# Patient Record
Sex: Female | Born: 1965
Health system: Southern US, Community
[De-identification: ages and names within clinical notes are randomized; demographics above are authoritative.]

## PROBLEM LIST (undated history)

## (undated) DIAGNOSIS — Z9889 Other specified postprocedural states: Secondary | ICD-10-CM

## (undated) DIAGNOSIS — K5790 Diverticulosis of intestine, part unspecified, without perforation or abscess without bleeding: Secondary | ICD-10-CM

## (undated) DIAGNOSIS — R102 Pelvic and perineal pain: Secondary | ICD-10-CM

## (undated) DIAGNOSIS — D219 Benign neoplasm of connective and other soft tissue, unspecified: Secondary | ICD-10-CM

## (undated) DIAGNOSIS — K529 Noninfective gastroenteritis and colitis, unspecified: Secondary | ICD-10-CM

## (undated) DIAGNOSIS — R112 Nausea with vomiting, unspecified: Secondary | ICD-10-CM

## (undated) DIAGNOSIS — E785 Hyperlipidemia, unspecified: Secondary | ICD-10-CM

## (undated) HISTORY — PX: COLONOSCOPY: SHX174

## (undated) HISTORY — PX: OTHER SURGICAL HISTORY: SHX169

## (undated) HISTORY — DX: Hyperlipidemia, unspecified: E78.5

## (undated) HISTORY — PX: VAGINAL HYSTERECTOMY: SUR661

## (undated) HISTORY — PX: DILATION AND CURETTAGE OF UTERUS: SHX78

---

## 1997-12-01 ENCOUNTER — Other Ambulatory Visit: Admission: RE | Admit: 1997-12-01 | Discharge: 1997-12-01 | Payer: Self-pay | Admitting: Vascular Surgery

## 1998-06-09 ENCOUNTER — Emergency Department (HOSPITAL_COMMUNITY): Admission: EM | Admit: 1998-06-09 | Discharge: 1998-06-09 | Payer: Self-pay | Admitting: Emergency Medicine

## 1998-06-09 ENCOUNTER — Encounter: Payer: Self-pay | Admitting: Emergency Medicine

## 1999-01-23 ENCOUNTER — Other Ambulatory Visit: Admission: RE | Admit: 1999-01-23 | Discharge: 1999-01-23 | Payer: Self-pay | Admitting: Obstetrics and Gynecology

## 1999-04-13 ENCOUNTER — Ambulatory Visit (HOSPITAL_COMMUNITY): Admission: RE | Admit: 1999-04-13 | Discharge: 1999-04-13 | Payer: Self-pay | Admitting: Obstetrics and Gynecology

## 1999-04-13 ENCOUNTER — Encounter (INDEPENDENT_AMBULATORY_CARE_PROVIDER_SITE_OTHER): Payer: Self-pay

## 2000-05-12 ENCOUNTER — Other Ambulatory Visit: Admission: RE | Admit: 2000-05-12 | Discharge: 2000-05-12 | Payer: Self-pay | Admitting: Obstetrics and Gynecology

## 2001-01-09 ENCOUNTER — Encounter: Payer: Self-pay | Admitting: *Deleted

## 2001-01-09 ENCOUNTER — Encounter: Admission: RE | Admit: 2001-01-09 | Discharge: 2001-01-09 | Payer: Self-pay | Admitting: *Deleted

## 2001-04-09 ENCOUNTER — Encounter: Admission: RE | Admit: 2001-04-09 | Discharge: 2001-04-09 | Payer: Self-pay | Admitting: Obstetrics and Gynecology

## 2001-04-09 ENCOUNTER — Encounter: Payer: Self-pay | Admitting: Obstetrics and Gynecology

## 2001-05-18 ENCOUNTER — Other Ambulatory Visit: Admission: RE | Admit: 2001-05-18 | Discharge: 2001-05-18 | Payer: Self-pay | Admitting: Obstetrics and Gynecology

## 2001-12-31 ENCOUNTER — Encounter: Payer: Self-pay | Admitting: Emergency Medicine

## 2002-01-01 ENCOUNTER — Inpatient Hospital Stay (HOSPITAL_COMMUNITY): Admission: EM | Admit: 2002-01-01 | Discharge: 2002-01-03 | Payer: Self-pay | Admitting: Emergency Medicine

## 2002-06-01 ENCOUNTER — Other Ambulatory Visit: Admission: RE | Admit: 2002-06-01 | Discharge: 2002-06-01 | Payer: Self-pay | Admitting: Obstetrics and Gynecology

## 2003-08-01 ENCOUNTER — Other Ambulatory Visit: Admission: RE | Admit: 2003-08-01 | Discharge: 2003-08-01 | Payer: Self-pay | Admitting: Obstetrics and Gynecology

## 2004-09-08 ENCOUNTER — Encounter: Admission: RE | Admit: 2004-09-08 | Discharge: 2004-09-08 | Payer: Self-pay | Admitting: Neurology

## 2004-12-10 ENCOUNTER — Encounter (INDEPENDENT_AMBULATORY_CARE_PROVIDER_SITE_OTHER): Payer: Self-pay | Admitting: *Deleted

## 2004-12-10 ENCOUNTER — Inpatient Hospital Stay (HOSPITAL_COMMUNITY): Admission: RE | Admit: 2004-12-10 | Discharge: 2004-12-14 | Payer: Self-pay | Admitting: Neurosurgery

## 2004-12-19 ENCOUNTER — Ambulatory Visit: Payer: Self-pay | Admitting: Gastroenterology

## 2004-12-20 ENCOUNTER — Inpatient Hospital Stay (HOSPITAL_COMMUNITY): Admission: EM | Admit: 2004-12-20 | Discharge: 2004-12-21 | Payer: Self-pay | Admitting: Emergency Medicine

## 2005-01-23 ENCOUNTER — Ambulatory Visit: Payer: Self-pay | Admitting: Internal Medicine

## 2005-02-04 ENCOUNTER — Ambulatory Visit: Payer: Self-pay | Admitting: Internal Medicine

## 2007-01-22 ENCOUNTER — Ambulatory Visit: Payer: Self-pay

## 2007-02-05 ENCOUNTER — Ambulatory Visit: Payer: Self-pay | Admitting: Cardiovascular Disease

## 2007-02-05 LAB — CONVERTED CEMR LAB
CO2: 30 meq/L (ref 19–32)
Calcium: 9 mg/dL (ref 8.4–10.5)
Chloride: 109 meq/L (ref 96–112)
GFR calc Af Amer: 117 mL/min
GFR calc non Af Amer: 97 mL/min
Glucose, Bld: 86 mg/dL (ref 70–99)
Potassium: 4.1 meq/L (ref 3.5–5.1)
Sodium: 142 meq/L (ref 135–145)

## 2007-02-11 ENCOUNTER — Ambulatory Visit: Payer: Self-pay | Admitting: Cardiology

## 2008-08-20 DIAGNOSIS — K224 Dyskinesia of esophagus: Secondary | ICD-10-CM | POA: Insufficient documentation

## 2008-08-20 DIAGNOSIS — Z8679 Personal history of other diseases of the circulatory system: Secondary | ICD-10-CM | POA: Insufficient documentation

## 2008-08-20 DIAGNOSIS — Z8719 Personal history of other diseases of the digestive system: Secondary | ICD-10-CM

## 2010-01-19 ENCOUNTER — Encounter: Payer: Self-pay | Admitting: Internal Medicine

## 2010-06-26 NOTE — Letter (Signed)
Summary: Colonoscopy Letter  [facility]  [address] [place], [place] [address]   Phone: [phone]  Fax: [phone]      January 19, 2010 MRN: [id number]   [facility] [address] [place], [place]  [address]   Dear Ms. Burck,   According to your medical record, it is time for you to schedule a Colonoscopy. The American Cancer Society recommends this procedure as a method to detect early colon cancer. Patients with a family history of colon cancer, or a personal history of colon polyps or inflammatory bowel disease are at increased risk.  This letter has been generated based on the recommendations made at the time of your procedure. If you feel that in your particular situation this may no longer apply, please contact our office.  Please call our office at [phone] to schedule this appointment or to update your records at your earliest convenience.  Thank you for cooperating with [place] to provide you with the very best care possible.   Sincerely,  Wilhemina Bonito. Marina Goodell, M.D.  [facility] [phone]

## 2010-10-09 NOTE — Assessment & Plan Note (Signed)
[facility]                            CARDIOLOGY OFFICE NOTE   Crystal Brooks, Crystal Brooks                    MRN:          [id number]  DATE:02/05/2007                            DOB:          November 30, 1965    Crystal Brooks is a very pleasant 45 year old patient referred by [facility]  for chest pain.  The patient really does not have a primary care doctor.  She has been seen by Dr. Marina Goodell in our group before.  The patient was  shopping for cell phones at [facility] around August 16 when she had the  sudden severe onset of central chest pain radiating to the back.  The  intense pressure lasted about 5 minutes and then started to subside.  There was some radiation to her neck.  The total episode lasted about 20  minutes.  She had intermittent pains after that.  Never quite felt  right.  About 4 days later, she had a recurrence and was seen at  [facility].  Her EKG was normal.  Chest x-ray was apparently normal.  There were no acute changes.  I have an actual report that was read out  on August 21.   The patient was subsequently referred for cardiology followup.  I do  have some lab work from that visit, which included a hematocrit of 36,  white count of 4.0.  LFTs were normal.  Her LDL cholesterol was 139, BUN  11, creatinine 0.6.   Enzymes were negative.   Since that time, the patient has had intermittent pain.  Her initial  episode sounds like it may have been esophageal spasm.  She has also  complained of a cough, which has been a bit nagging over the last 2 to 3  months.  This may be secondary to reflux.  It does tend to occur with  recumbency.   She is a nonsmoker.  She has not had any particular allergies, and there  has been no exposure to mold or asbestos.   The patient initially was referred here.  I talked to [facility] on the  phone, and we approved a stress test on her prior to her seeing me.  I  reviewed the results of this with the patient.  The  stress test was done  January 22, 2007.  She exercised for 6 minutes 30 seconds under standard  Bruce protocol and stopped for fatigue.  The Myoview pictures were  totally normal with an EF of 74%.   I went over these results with the patient in detail.   REVIEW OF SYSTEMS:  Otherwise, unremarkable.   The patient is taking no medications on a regular basis.  She is  allergic to AMOXICILLIN and SULFA.   She is happily married.  She has one 40 year old child who goes to  [place].  She is fairly sedentary, but occasionally does aerobics at  home.  She does not smoke or drink.  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   SURGICAL HISTORY:  Remarkable for meningeoma removal in 2006 by Dr.  Newell Coral.  She apparently had some paresthesias in her  face  necessitating the surgery.   Mother is dead at age 23 with colon cancer.  Father died at age 37 of  CJD.  She has 1 brother who is alive.   EXAM:  Remarkable for a healthy-appearing white female in no distress.  Blood pressure is 140/80, pulse 90 and regular, weight is 176,  respiratory rate is 14.  She is afebrile.  HEENT:  Normal.  Carotids normal without bruit.  There is no lymphadenopathy, no  thyromegaly, no JVP elevation.  LUNGS:  Clear with good diaphragmatic motion.  No wheezing.  There is an S1, S2 with normal heart sounds.  PMI is normal.  ABDOMEN:  Benign.  Bowel sounds positive.  No AAA.  No tenderness.  No  hepatosplenomegaly.  No hepatojugular reflux.  Distal pulses are intact.  No edema.  There is no muscular weakness.  NEURO:  Nonfocal.  SKIN:  Warm and dry.   Her baseline EKG is normal.   IMPRESSION:  1. Chest pain, nonexertional, sounds more like esophageal spasm.      Myoview normal.  Limited risk factors.  Continue observation.  No      need for medication.  2. Question cough secondary to reflux.  Possible esophageal spasm.      Follow up with Dr. Marina Goodell.  Prescription for Protonix 40 mg a day      given.  3. A  history of meningioma status post resection by Dr. Newell Coral.      Follow up as needed.  I suspect he does a surveillance MR/CT every      3 years.  4. Since the patient has had recurring episodes of pain and it does      not appear to be cardiac, we will do a chest CT to rule out any      other mediastinal pathology.  She had a creatinine of 0.6 at urgent      care and I do not think this needs to be repeated.   So long as her CT scan does not show any abnormality, I will let her  follow up with Dr. Marina Goodell and she will have a trial of Protonix.     Crystal Brooks. Eden Emms, MD, [facility]  Electronically Signed    PCN/MedQ  DD: 02/05/2007  DT: 02/06/2007  Job #: [id number]   cc:   Shan Levans. Marina Goodell, MD

## 2010-10-12 NOTE — Discharge Summary (Signed)
NAME:  Crystal Brooks, Crystal Brooks NO.:  [id number]   MEDICAL RECORD NO.:  [id number]          PATIENT TYPE:  INP   LOCATION:  3027                         FACILITY:  [facility]   PHYSICIAN:  Hewitt Shorts, M.D.DATE OF BIRTH:  Nov 28, 1965   DATE OF ADMISSION:  12/10/2004  DATE OF DISCHARGE:  12/14/2004                                 DISCHARGE SUMMARY   HISTORY:  The patient is a 45 year old woman with a history of headaches  that were worse, with some numbness in the left face.  An MRI scan was  obtained which revealed a left cerebellar pontine angle tumor that was  located posterior to the left internal auditory meatus and suspected to be a  meningioma.  The decision was made to admit the patient for a craniotomy and  resection of the tumor.   PHYSICAL EXAMINATION:  GENERAL:  Unremarkable.  NEUROLOGIC:  Intact.   HOSPITAL COURSE:  The patient was admitted and underwent the left sub-  occipital craniectomy and resection of tumor with micro-dissection.  She did  well following surgery.  She did have some nausea that cleared.  She had a  moderate amount of incisional pain that has also eased.  Her vital signs  have been stable.  Her wound has healed nicely.  Her Decadron was tapered.  At this point she is up and ambulating in the halls.  She is neurologically  intact.  He wound is healing well.  The pathology reports a fibrous  meningioma.   DISPOSITION:  The patient is being discharged to home for further care.   FOLLOWUP:  She is to return next week for staple removal.   DISCHARGE MEDICATIONS:  1.  She is given a prescription for Decadron 4 mg, to take 1 tab this      evening, 1/2 tab b.i.d. tomorrow and 1/2 tab once daily on July 23rd and      December 17, 2004.  Then discontinue the Decadron.  2.  A Vicodin prescription also given for one or two tab q.4-6h. p.r.n.      pain, #50 tablets, no refills.  3.  She is also advised to use Aleve, two tab b.i.d. to help with some  of      the inflammation and soreness.   DISCHARGE DIAGNOSIS:  Left cerebral pontine angle meningioma.       RWN/MEDQ  D:  12/14/2004  T:  12/14/2004  Job:  [id number]

## 2010-10-12 NOTE — H&P (Signed)
NAME:  Crystal Brooks, Crystal Brooks                       ACCOUNT NO.:  [id number]   MEDICAL RECORD NO.:  [id number]                   PATIENT TYPE:  INP   LOCATION:  0447                                 FACILITY:  [facility]   PHYSICIAN:  Carroll Sage, M.D.              DATE OF BIRTH:  [birth year]   DATE OF ADMISSION:  12/31/2001  DATE OF DISCHARGE:                                HISTORY & PHYSICAL   HISTORY OF PRESENT ILLNESS:  The patient is a 45 year old female with no  past medical history, presenting with acute onset 24 hours ago of diarrhea,  loose frequent large amounts of stool occurring almost every hour or less  with significant rectal bleeding and large clumps after the diarrhea  resolved. She also had associated nausea. She denies any fevers or chills  associated with this. She also had some abdominal pain that would wax and  wane but not localized that would go to her back. She denies ever having any  of these symptoms in the past. She denies any chest pain, shortness of  breath, dysuria, lower extremity edema.   REVIEW OF SYSTEMS:  The rest of review of systems was negative. Denies  rashes or joint pain.   PAST MEDICAL HISTORY:  As stated, no past medical history.   PAST SURGICAL HISTORY:  No surgical history.   SOCIAL HISTORY:  Denies tobacco, alcohol, or drugs.   CURRENT MEDICATIONS:  No medications.   ALLERGIES:  She has a rash to Amoxicillin as her allergy.   PHYSICAL EXAMINATION:  VITAL SIGNS: Blood pressure was 132/92, pulse 122,  respiratory rate 18. O2 sat was 99% on room air with temperature of 99.6.  GENERAL: No apparent distress. Alert and oriented times three.  HEENT: Extraocular muscles intact. Pupils are equal, round, and reactive to  light and accommodation. She had dry oropharynx.  LUNGS: Clear to auscultation and percussion.  HEART: Normal S1 and S2. Sinus tachycardia. No murmur, rub, or gallop  appreciated.  ABDOMEN: Showed some mild tenderness in the  left lower quadrant. No rebound  or guarding. Hypoactive bowel sounds. No flank pain on examination. No lower  extremity edema.   LABORATORY DATA:  White count 12.5, hemoglobin and hematocrit 15 and 43.7,  platelet count 239, ketones greater than 80 in the urine. Nitrate and  lymphocyte esterase were negative. She had a negative pregnancy test. Sodium  was 136, potassium 4.2, chloride 106, CO2 24, BUN 16, creatinine 0.7,  glucose 105, normal LFTs.   DIAGNOSTIC STUDIES::  CT of the abdomen showed mucosal edema of the  rectosigmoid and descending colon, consistent with inflammatory, infectious,  or ischemic colitis.   ASSESSMENT:  This is a 45 year old female with no significant past medical  history, presenting with acute onset of diarrhea, dehydration and lower GI  bleeding, consistent with red blood.    PLAN:  Treat her as infectious with Cipro and Flagyl. Get stool studies.  Obtain a GI consult for possible colonoscopy and rehydrate aggressively.                                               Carroll Sage, M.D.    Angelyn Punt  D:  01/01/2002  T:  01/05/2002  Job:  [id number]

## 2010-10-12 NOTE — Discharge Summary (Signed)
NAME:  Crystal Brooks, Crystal Brooks NO.:  [id number]   MEDICAL RECORD NO.:  [id number]          PATIENT TYPE:  INP   LOCATION:  5509                         FACILITY:  [facility]   PHYSICIAN:  Malcolm T. Russella Dar, M.D. [facility] OF BIRTH:  [birth year]   DATE OF ADMISSION:  12/19/2004  DATE OF DISCHARGE:  12/21/2004                                 DISCHARGE SUMMARY   ADMISSION DIAGNOSES:  1.  Acute colitis, probably infectious.  2.  History of acute colitis, presumed secondary to infection in August      2003.  3.  Status post colonoscopy September 2003 revealing adenomatous colon      polyps and diverticulosis.  4.  Status post recent craniotomy and resection of meningioma on July 17 by      Dr. Newell Coral. Still having ongoing symptoms of numbness of the face and      scalp and dizziness.  5.  Sinus tachycardia, probably secondary to dehydration and volume      depletion associated with the acute colitis.  6.  Allergy to AMOXICILLIN.   DISCHARGE DIAGNOSES:  1.  Acute colitis, etiology presumed to be infectious. Symptoms much      improved with supportive care.  2.  Sinus tachycardia, resolved.  3.  Status post recent craniotomy and meningioma resection with residual      neurologic symptoms which are apparently not unexpected.   PROCEDURES:  None.   CONSULTATIONS:  None.   BRIEF HISTORY:  Mrs. Laham is a 45 year old white female. She underwent  craniotomy by Dr. Newell Coral July 17. He resected a left-sided meningioma.  Sutures were removed in the office yesterday, and her symptoms of dizziness  and numbness were not considered unexpected by Dr. Newell Coral.   The patient had constipation since she was first admitted for the surgery on  July 17 and had not moved her bowels for the duration of hospital stay and  for some days after returning home on July 21. The constipation may have  been secondary to moderate use of Vicodin for pain control. She has been  using about 4 Vicodin per  24 hours since she was discharged. The patient  began using laxatives, specifically some enemas and magnesium citrate on  July 25 through July 26. She developed acute crampy pain and diarrhea which  was bloody. She had several bowel movements, all of which had some amount of  blood, but she was not copiously bleeding. She was feeling unwell and  presented to the emergency room. The patient does not have a primary care  physician. Therefore, [facility] was consulted. Dr. Yancey Flemings had been a  consultant when she was admitted in the summer of 2003 with an acute colitis  whose symptoms were pretty much an exact replica of this admission's  presenting symptoms. At that time she was treated with Cipro and Flagyl, and  her symptoms resolved within a couple of days. She had followup colonoscopy  about 5 weeks after that acute episode in 2003. This revealed no colitis but  did show some diverticulosis, and adenomatous colon polyps were removed. She  is  due for followup colonoscopy around September 2006.   Dr. Russella Dar saw the patient in the emergency room. She was admitted to the GI  service for supportive care. She was afebrile. Her white blood cell count  was somewhat elevated at 18.6, but hemoglobin was 12.5. She did have slight  tachycardia when she first came to the emergency room with a pulse of 132,  but, with hydration, this ultimately normalized. She was not hypotensive. In  fact, her blood pressure on admission was 130/94.   LABORATORY DATA ON ADMISSION:  Sodium 137, potassium 4.4, glucose 106, BUN  9, creatinine 0.7. LFTs were within normal limits. White blood cell count  was 18.6, hemoglobin 15.9, hematocrit 46 and MCV 86. Platelets were 336,000.  Followup CBC on July 27 revealed a white blood cell count at 15.7 and a  hemoglobin of 12.5. Urinalysis was negative for nitrites, and there was a  small amount of leukocyte esterase present. However, only 3 to 6 white blood  cells and 0 to 2  red blood cells were present, and bacteria was present in  rare amounts.   CT scan of the abdomen and pelvis: There was wall thickening involving the  left colon with inflammatory versus infectious colitis being the most likely  etiology. There was a 1 cm filling defect in the gallbladder, possibly a  stone versus a polyp. If further characterization needed, a ultrasound could  be performed. There was no evidence for any perforation or abscess. On  pelvic view, there was some small amount of fluid in the pericolic gutters.   HOSPITAL COURSE:  The patient was admitted to non-telemetry bed. A she had  begun aggressive fluid hydration with normal saline with added potassium  while in the emergency room. Ultimately she was changed over to D5 half  normal saline. Overnight she did have a few more bloody bowel movements but  nothing like the volume or frequency she had been experiencing prior to her  present presentation. Initially antibiotics were not started. However, on  day #2 of her hospital stay, we did start oral Cipro and Flagyl. She  remained on a clear liquid diet which she tolerated.   By hospital day #3, the patient had had only a single bowel movement within  the last 16 hours, and this was not grossly bloody. Her abdominal pain had  diminished considerably. She was displaying just minimal tenderness to  palpation. She was stable for discharge and was discharged that morning of  July 27.   The patient was set up for outpatient colonoscopy at the [facility] on September 5 with a preop nurse visit on August 30. She was  provided with a prescription for Cipro and Flagyl, both of 1 week duration.   MEDICATIONS AT DISCHARGE:  1.  Vicodin 1 to 2 every 6 hours as needed.  2.  Tylenol as needed.  3.  She was advised to avoid Aleve, ibuprofen, and aspirin products.  4.  Metronidazole 500 milligrams 1 p.o. t.i.d. for 7 days. 5.  Cipro 500 milligrams 1 p.o. b.i.d. for 7  days.   CONDITION ON DISCHARGE:  Stable and improved.      SG/MEDQ  D:  12/21/2004  T:  12/21/2004  Job:  [id number]   cc:   Wilhemina Bonito. Marina Goodell, M.D. [facility]

## 2010-10-12 NOTE — H&P (Signed)
NAME:  Crystal Brooks, Crystal Brooks NO.:  [id number]   MEDICAL RECORD NO.:  [id number]          PATIENT TYPE:  INP   LOCATION:  NA                           FACILITY:  [facility]   PHYSICIAN:  Hewitt Shorts, M.D.DATE OF BIRTH:  01-30-1966   DATE OF ADMISSION:  DATE OF DISCHARGE:                                HISTORY & PHYSICAL   HISTORY OF PRESENT ILLNESS:  The patient is a 45 year old right-handed white  female who has had headaches for 30 years .  She has more difficulties  though over the past 6-7 months with occasional numbness and tingling that  began in the left V2 and V3 distribution that is associated with her  headaches.  She has had two episodes of blurred vision, once in December  2005 and again in January 2006 about four weeks apart.  The headaches  typically begin in the posterior aspect of her neck and extend up into the  occiput and vertex bilaterally.  She does not describe any hearing  difficulties, imbalance, facial pains, diplopia, seizures or weakness.  MRI  of the brain was obtained and revealed a left cerebellar pontine angle tumor  which appears to be posterior to the internal auditory meatus. A decision  was made to proceed with suboccipital craniectomy and resection of tumor.   PAST MEDICAL HISTORY:  She does not describe any history of hypertension,  myocardial infarction, cancer, strokes, diabetes, peptic ulcer disease or  lung disease.   PAST SURGICAL HISTORY:  No recent surgeries.   ALLERGIES:  AMOXICILLIN THAT CAUSES DIFFUSE RASH OVER HER BODY.   CURRENT MEDICATIONS:  She does not take any medications at this time.   FAMILY HISTORY:  Her parents are passed on.  Her mother died at age 32 of  colon cancer.  Her father died at age 10 of Creutzfeldt-Jakob disease.  Her  mother also had a history of hypertension and heart disease.   SOCIAL HISTORY:  The patient is married.  She is a housewife.  She does not  smoke.  She does drink alcoholic  beverages socially.  She denies any history  of substance abuse.   REVIEW OF SYSTEMS:  Notable for what is described in the history of present  illness, past medical history and review of systems other unremarkable.   PHYSICAL EXAMINATION:  GENERAL:  The patient is a well-developed, well-  nourished white female in no acute distress.  VITAL SIGNS:  Temperature 98.4, pulse 86, blood pressure 123/86, respiratory  rate 16, height 5 feet 9 inches, weight 164 pounds.  LUNGS:  Clear to auscultation.  She has symmetrical respiratory excursion.  HEART:  Regular rate and rhythm.  S1 and S2.  There is no murmur.  ABDOMEN:  Soft, nondistended, bowel sounds are present.  EXTREMITIES:  Shows no clubbing, cyanosis, or edema.  NEUROLOGIC:  The patient is awake, alert, fully oriented, her speech is  fluent and she has good comprehension.  On cranial nerve exam funduscopic  exam shows no evidence of papilledema, hemorrhages or exudates.  Motor  examination shows 5/5 strength in the upper and lower extremities.  She has  no drift of the upper extremities.  She has a normal gait and normal tandem  gait and normal stance.  Sensation is intact to pinprick throughout.  Reflexes are 1-2+ in the biceps, brachioradialis, triceps, quadriceps, and  gastrocnemious and are symmetrical bilaterally.  HEENT:  Pupils equal, round, and reactive to light about 3 mm bilaterally.  Extraocular movements are intact. Facial sensation is intact.  Facial  movement is symmetrical.  Hearing was present bilaterally.  Palatal movement  is symmetrical.  Shoulder shrug is symmetrical.  Tongue is midline.   IMPRESSION:  Left cerebellar pontine angle meningioma.  The patient is  neurologically intact.  Long-standing history of headaches but with some  recent numbness and tingling in the left V2 and V3 distribution as well as  two episodes of blurred vision about 6-7 months ago.   PLAN:  The patient will be admitted for left  suboccipital craniectomy and  resection of tumor with microdissection and microsurgery.  We discussed the  nature of surgery, the typical length of surgery and recuperation, and the  risks of surgery including infection, bleeding, possible neurologic  dysfunction including loss of facial sensation, changes in moving, hearing,  balance, swallowing as well as risk of coma and death.  We also discussed  the possibility of recurrence of tumor and risks of myocardial infarction,  stroke, pneumonia and death.  Patient stated she wishes to go ahead with  surgery and was admitted for such.       RWN/MEDQ  D:  12/10/2004  T:  12/10/2004  Job:  [id number]

## 2010-10-12 NOTE — H&P (Signed)
NAME:  Crystal Brooks, Crystal Brooks NO.:  [id number]   MEDICAL RECORD NO.:  [id number]          PATIENT TYPE:  INP   LOCATION:  5509                         FACILITY:  [facility]   PHYSICIAN:  Malcolm T. Russella Dar, M.D. [facility] OF BIRTH:  Jun 25, 1965   DATE OF ADMISSION:  12/19/2004  DATE OF DISCHARGE:                                HISTORY & PHYSICAL   PRIMARY CARE PHYSICIAN:  None.   GASTROENTEROLOGIST:  Wilhemina Bonito. Marina Goodell, M.D. [facility]   CHIEF COMPLAINT:  Bloody diarrhea and cramping abdominal pain.   HISTORY OF PRESENT ILLNESS:  Crystal Brooks is a 45 year old white female.  She  had just undergone craniotomy on July 17 by Dr. Shirlean Kelly.  She had  resection of a left sided meningioma.  Sutures were removed at Dr.  Earl Gala office yesterday.  She does have some residual numbness in the  face and scalp and some persistent dizziness, but apparently, this is not  unexpected.  GI wise, she had a history of what was presumed to be  infectious colitis in August 2003 when she presented with symptoms very  similar to her presenting symptoms today.  That is, she was and now is  having bloody diarrhea with abdominal pain that is a little bit worse on the  left side than the right side, but is somewhat diffuse.  In 2003,  microscopic culture studies on the stools were negative.  She was treated  with Cipro and Flagyl, and these symptoms resolved quickly.  She underwent  outpatient colonoscopy about a month after the initial event.  That was done  on February 11, 2002, by Dr. Marina Goodell.  He removed some polyps, and  diverticulosis was noted.  Do not known the result of the polyp pathology at  this point.  That will have to be investigated.   The patient notes that she had not had any bowel movements during her  hospital stay, going back at least to July 17.  Within the last 36 hours  prior to admission, she started using laxatives, specifically magnesium  citrate and some enemas.  At about 2:30  p.m. yesterday, she developed  crampy, abdominal pain with some nausea.  She finally started having stools,  and these were loose brown mixed with blood.  She has had persistent  stooling with blood noted mixed in with the stool.  The patient does not  generally use nonsteroidals or aspirin products, but she did take two  ibuprofen last night because of the pain, and because she was told not to  use her postoperative hydrocodone if she was not eating.  She says that she  was averaging about two hydrocodone twice a day since the time of surgery.  The patient has been urinating well.  She has not had any fever or chills.   ALLERGIES:  AMOXICILLIN.   MEDICATIONS:  Hydrocodone/APAP two p.o. b.i.d., though, she has not had any  within the last 24 hours.   PAST MEDICAL HISTORY:  1.  History of colon polyps.  2.  Colonic diverticulosis.  3.  Acute colitis.  4.  Status post resection of  a meningioma July 17.   SOCIAL HISTORY:  The patient lives in [place] with her husband, currently  unemployed.  She is married.   FAMILY HISTORY:  Mother had colon cancer.   REVIEW OF SYSTEMS:  The patient does have well water at home, but she drinks  bottled water.  The patient is urinating okay.  She denies chest pain or  shortness of breath.  Denies palpitations.  Denies extremity edema.  Menses  are regular, and last menses started around July 5.  Denies fever.  Has  numbness in the left side of her face and scalp.  Having dizziness  __________ which had been present prior to the surgery.  Denies any falls.  She denies any sick contacts or family members with similar GI symptoms.   LABORATORY DATA:  Hemoglobin 15.9, hematocrit 46, white blood cell count  18.6, platelets 336,000.  Total bilirubin 1.1, alk-phos 70, AST 16, ALT 25.  Sodium 137, potassium 4.4, chloride 101, CO2 25, BUN none, creatinine 0.7,  glucose 106.  Albumin 3.5.  PT 13.4, INR 1.0, PTT 30.  Urinalysis not  performed.  CT scan of  the abdomen and pelvis revealed left sided colitis.  A filling to defect in the gallbladder and uterine fibroid.   PHYSICAL EXAMINATION:  VITAL SIGNS:  Temperature 97.4, blood pressure  130/94, pulse 135, respirations 22.  GENERAL APPEARANCE:  The patient is a pleasant, pale looking white female  with flat affect.  She does look uncomfortable, but not toxic.  HEENT:  No pallor present.  Extraocular movements intact.  Healing scar in  the right posterior scalp.  Oropharynx:  Mucous membranes are clear and  moist.  NECK:  No masses.  No JVD.  CHEST:  Clear to auscultation and percussion bilaterally.  No displaying any  shortness of breath and not coughing.  COR:  Regular rate and rhythm, but she is tachycardic.  No murmurs, rubs or  gallops appreciated.  ABDOMEN:  Soft, diffusely tender in the mid abdomen.  A little bit worse on  the left than on the right.  There is no guarding or rebound.  Bowel sounds  are active.  RECTAL:  No stool on the exam glove.  The rectum is tender.  No friable-  looking tissue, and no gross blood.  EXTREMITIES:  No cyanosis, clubbing or edema.  PSYCHIATRIC:  Affect flat.  The patient is appropriate.  NEUROLOGICAL:  Moving all fours.  Grip and peel strength 5/5.  No tremors  present.   IMPRESSION:  1.  Acute colitis, probably infectious.  2.  History of acute colitis attributed to infectious etiology.  3.  History of colon polyps and history of diverticulosis.  4.  Status post recent craniotomy and meningioma resection with some      persistent and apparently not unexpected neurologic symptoms.  5.  Sinus tachycardia, probably secondary to element of dehydration and      volume depletion associated with her diarrhea.   PLAN:  1.  The patient will be admitted to the hospital and allowed clear liquids.      Will aggressively hydrate her.  2.  Follow up to CBC in the morning.  3.  Dilaudid and Phenergan p.r.n. 4.  For now, will hold off on antibiotics,  but consider addition of Cipro      and Flagyl to the medical regimen.       SG/MEDQ  D:  12/20/2004  T:  12/20/2004  Job:  [id number]

## 2010-10-12 NOTE — Op Note (Signed)
Crystal Brooks, NOYES NO.:  [id number]   MEDICAL RECORD NO.:  [id number]          PATIENT TYPE:  INP   LOCATION:  2899                         FACILITY:  [facility]   PHYSICIAN:  Hewitt Shorts, M.D.DATE OF BIRTH:  [birth year]   DATE OF PROCEDURE:  12/10/2004  DATE OF DISCHARGE:                                 OPERATIVE REPORT   PREOP DIAGNOSIS:  Left cerebellopontine angle meningioma.   POSTOP DIAGNOSIS:  Left cerebellopontine angle meningioma.   PROCEDURE:  Left suboccipital craniectomy and gross total resection of tumor  with a microdissection.   SURGEON:  Hewitt Shorts, M.D.   ASSISTANT:  Hilda Lias, M.D.   ANESTHESIA:  General endotracheal.   INDICATION:  The patient is a 45 year old woman who has a longstanding  history of headaches but who had some changes with numbness in the left side  of her face. An MRI was obtained and revealed a tumor in the left  cerebellopontine angle posterior to the internal auditory meatus. The  overall appearance radiologically was that of meningioma. The decision was  made to proceed with elective resection.   PROCEDURE IN DETAIL:  The patient was brought to the operating room and  placed under general endotracheal anesthesia.  The Mayfield head holder was  applied and the patient was turned to a near prone position with rolls  lifting the left side of the body slightly up off the bed and the neck  gently flexed. The left suboccipital scalp was shaved, prepped with Betadine  soap solution, and draped in a sterile fashion. A vertical paramedian  incision gently curved at the inferior aspect was made. The skin and  subcutaneous tissues were infiltrated with local anesthetic with  epinephrine. Dissection was carried down through the subcutaneous tissue and  then through the nuchal musculature. The periosteum was divided in the  occipitals. The skull was identified and the scalp elevated off of the  suboccipital  region. Dissection was carried out laterally toward the mastoid  process, medially toward the midline, and inferiorly toward the foramen  magnum. Self-retaining retractors were placed and we used an Apfelbaum  retractor. A suboccipital craniectomy was performed using the X Max drill,  double-action rongeurs, and Kerrison punches. The dura was carefully  dissected from the overlying skull and then the skull removed. Dissection  was carried out superiorly to the transverse sinus, laterally to the sigmoid  sinus. Once the skull exposure was adequate, the dura was opened in an X-  shaped fashion with flaps hinged superiorly towards the transverse sinus and  lateral to the sigmoid sinus. The microscope was draped and with the field  divided, additional magnification illumination and visualization, the  remainder of the dissection and ventral tumor resection was performed using  microsurgical dissection technique We exposed the cisterna magna which was  opened. CSF was aspirated and good relaxation of the cerebellar hemisphere  was achieved. A bridging vein between the cerebellum and the transverse  sinus was freed from its arachnoid adhesions, coagulated, and divided. We  then gently retracted the cerebellar hemisphere medially and the tumor was  identified. Using  the Apfelbaum retractor, we were able to expose the tumor  well, and the surface the tumor was coagulated and then incised. A specimen  of tumor was obtained for frozen section. Dr. Kieth Brightly reported that the  findings were consistent with a meningioma. Additional tumor was  subsequently obtained for permanent section, which is pending. Once the  capsule had been incised and the specimen was obtained, we then proceeded to  debulk the tumor from within the tumor capsule using the Cavitron ultrasonic  aspirator (CUSA). Good debulking of the tumor was achieved and we then  progressively coagulated the tumor's capsule so that it shrank  away from the  brain and cranial nerves We were able to remove the tumor using the CUSA.  Additional specimens were obtained for permanent section. The attachment to  the dura and the skull showed evidence of hyperostosis of the underlying  skull. We were able to remove the entire dural attachment of the tumor and  all the tumor was removed achieving a gross total resection of tumor  Subsequently, we were able to identify the nerves exiting through the  internal auditory meatus, as well as lower down, the ninth and tenth nerves.  The area of hyperostosis was cleaned of any remaining dural patch and using  a diamond bit bur to shave down the surface of the hyperostosis. It was felt  that a gross total resection of tumor had been achieved. The intracranial  cavity was irrigated with saline solution and it was found that good  hemostasis had been achieved. We then removed cottonoid paddies that had  been placed underneath the retractor after the retractor had been removed.  The brain remained relaxed and pulsating well, and we then proceeded with  closure. The dura was closed with interrupted 4-0 Nurolon sutures, as well  as a dural patch using dura guard which was sutured in place with multiple  interrupted 4-0 Nurolon sutures. A good watertight closure was achieved, and  then we proceeded with scalp closure. The nuchal muscles were approximated  with interrupted undyed 2-0 Vicryl sutures. The galea and subcutaneous layer  were closed with inverted 2-0 Vicryl sutures. The skin was closed with  surgical staples and was dressed with Adaptic and sterile gauze. Following  surgery, we began to prepare the patient to return back to the supine  position. However, she began to emerge from the anesthetic and she was  quickly turned back to supine position and the three pin Mayfield head  holder was removed. No bleeding from the scalp sites was noted. The patient was further reversed from anesthetic  and successfully extubated. She was  moving all four extremities, following commands. Subsequently in the  recovery room, she has been oriented, following commands, moving all four  extremities, but having difficulties with nausea and a small amount of  emesis.       RWN/MEDQ  D:  12/10/2004  T:  12/10/2004  Job:  [id number]

## 2010-10-12 NOTE — Discharge Summary (Signed)
NAME:  Crystal Brooks, Crystal Brooks                       ACCOUNT NO.:  [id number]   MEDICAL RECORD NO.:  [id number]                   PATIENT TYPE:  INP   LOCATION:  0452                                 FACILITY:  [facility]   PHYSICIAN:  Jackie Plum, M.D.             DATE OF BIRTH:  1965/07/08   DATE OF ADMISSION:  01/01/2002  DATE OF DISCHARGE:  01/03/2002                                 DISCHARGE SUMMARY   DISCHARGE DIAGNOSES:  Bloody diarrhea, likely infectious colitis - resolved.  CT scan done on January 01, 2002 remarkable for extensive colitis.  Urinalysis  remarkable for rare wbc's.  Clostridium difficile toxin negative.  Stool  culture for enteric pathogen - final results pending and needs to be  followed up as an outpatient.   DISCHARGE MEDICATIONS:  1. Cipro 500 mg one tablet p.o. b.i.d. for six days.  2. Flagyl 500 mg tablets one t.i.d. for six days.  3. Bentyl 10 mg tablet q.i.d. p.r.n. for abdominal cramps.  4. Darvocet-N 100 one to two tablets q.4-6h. p.r.n.   ACTIVITY:  As tolerated. Pain control with Darvocet p.r.n. as mentioned  above.   SPECIAL INSTRUCTIONS:  Patient should report to M.D. or ED if there are any  problems including fever, chills, diarrhea.  Follow-up will be with Dr.  Marina Goodell in two to three weeks.  The patient is to call for appointment.   CONSULTANT:  Dr. Marina Goodell of gastroenterology.   PROCEDURES:  Not applicable.   CONDITION ON DISCHARGE:  Improved and stable.   REASON FOR HOSPITALIZATION:  Bloody, watery diarrhea.   HOSPITAL COURSE:  The patient does not have any significant medical history.  Presented with history of bloody, watery stools.  The patient was admitted  to the medical service.  She was started on IV fluid supplementation with  ciprofloxacin and Flagyl for presumptive infectious diarrhea.  She was  treated with Darvocet for pain control and also Bentyl for abdominal cramps.  Stool studies were done.  Results as indicated above.  Dr.  Marina Goodell of GI was  consulted who agreed with our work-up.  His impression would be acute  ischemic colitis versus infectious colitis.  The patient gives history of  colon cancer in his family.  With supportive care patient's symptoms  improved markedly with absence of any bloody stools at the time of discharge  and outpatient colonoscopy was planned.  The patient is going home to  continue antibiotics for presumptive infectious diarrhea and also to follow  up with Dr. Marina Goodell as mentioned above for outpatient colonoscopy.                                               Jackie Plum, M.D.   GO/MEDQ  D:  01/03/2002  T:  01/07/2002  Job:  [id number]  cc:   Wilhemina Bonito. Eda Keys., M.D. [facility]

## 2011-02-20 ENCOUNTER — Other Ambulatory Visit: Payer: Self-pay | Admitting: Obstetrics

## 2011-02-28 ENCOUNTER — Other Ambulatory Visit: Payer: Self-pay | Admitting: Obstetrics

## 2011-02-28 DIAGNOSIS — D259 Leiomyoma of uterus, unspecified: Secondary | ICD-10-CM

## 2011-02-28 NOTE — Patient Instructions (Addendum)
   Your procedure is scheduled on:  Friday, March 08, 2011  Enter through the [employer] of [facility] at:  6:00am Pick up the phone at the desk and dial [phone] and inform us of your arrival  Please call this number if you have any problems the morning of surgery: [phone]  Remember: Do not eat food after midnight  Thursday, Oct.11th Do not drink clear liquids after:  Thursday, Oct.11th Take these medicines the morning of surgery with a SIP OF WATER:  none  Do not wear jewelry, make-up, or FINGER nail polish Do not wear lotions, powders, or perfumes.  You may wear deodorant. Do not shave 48 hours prior to surgery. Do not bring valuables to the hospital.  Leave suitcase in the car. After Surgery it may be brought to your room. For patients being admitted to the hospital, checkout time is 11:00am the day of discharge.  Patients discharged on the day of surgery will not be allowed to drive home.   Name and phone number of your driver:  husband Ray  cell [phone]   Remember to use your hibiclens as instructed.Please shower with 1/2 bottle the evening before your surgery and the other 1/2 bottle the morning of surgery.

## 2011-03-04 ENCOUNTER — Encounter (HOSPITAL_COMMUNITY)
Admission: RE | Admit: 2011-03-04 | Discharge: 2011-03-04 | Disposition: A | Discharge status: Home / Self Care | Payer: BC Managed Care – PPO | Source: Ambulatory Visit | Attending: Obstetrics | Admitting: Obstetrics

## 2011-03-04 ENCOUNTER — Encounter (HOSPITAL_COMMUNITY): Payer: Self-pay

## 2011-03-04 ENCOUNTER — Ambulatory Visit
Admission: RE | Admit: 2011-03-04 | Discharge: 2011-03-04 | Disposition: A | Discharge status: Home / Self Care | Payer: BC Managed Care – PPO | Source: Ambulatory Visit | Attending: Obstetrics | Admitting: Obstetrics

## 2011-03-04 DIAGNOSIS — D259 Leiomyoma of uterus, unspecified: Secondary | ICD-10-CM

## 2011-03-04 HISTORY — DX: Pelvic and perineal pain: R10.2

## 2011-03-04 HISTORY — DX: Other specified postprocedural states: R11.2

## 2011-03-04 HISTORY — DX: Benign neoplasm of connective and other soft tissue, unspecified: D21.9

## 2011-03-04 HISTORY — DX: Other specified postprocedural states: Z98.890

## 2011-03-04 HISTORY — DX: Noninfective gastroenteritis and colitis, unspecified: K52.9

## 2011-03-04 HISTORY — DX: Diverticulosis of intestine, part unspecified, without perforation or abscess without bleeding: K57.90

## 2011-03-04 LAB — CBC
HCT: 37.1 % (ref 36.0–46.0)
Hemoglobin: 11.3 g/dL — ABNORMAL LOW (ref 12.0–15.0)
MCH: 23.4 pg — ABNORMAL LOW (ref 26.0–34.0)
MCHC: 30.5 g/dL (ref 30.0–36.0)
MCV: 76.8 fL — ABNORMAL LOW (ref 78.0–100.0)
Platelets: 269 10*3/uL (ref 150–400)
RDW: 16.8 % — ABNORMAL HIGH (ref 11.5–15.5)

## 2011-03-04 LAB — SURGICAL PCR SCREEN
MRSA, PCR: NEGATIVE
Staphylococcus aureus: NEGATIVE

## 2011-03-04 LAB — BASIC METABOLIC PANEL
BUN: 11 mg/dL (ref 6–23)
GFR calc non Af Amer: >90 mL/min (ref >90)

## 2011-03-04 MED ORDER — GADOBENATE DIMEGLUMINE 529 MG/ML IV SOLN
14.0000 mL | Freq: Once | INTRAVENOUS | Status: AC | PRN
  Administered 2011-03-04: 14 mL via INTRAVENOUS

## 2011-03-07 MED ORDER — DEXTROSE 5 % IV SOLN: 900.0000 mg | Freq: Once | INTRAVENOUS | Status: DC

## 2011-03-07 MED ORDER — CLINDAMYCIN PHOSPHATE 900 MG/50ML IV SOLN
900.0000 mg | INTRAVENOUS | Status: AC
  Administered 2011-03-08: 900 mg via INTRAVENOUS
  Filled 2011-03-07: qty 50

## 2011-03-08 ENCOUNTER — Encounter (HOSPITAL_COMMUNITY): Payer: Self-pay | Admitting: Anesthesiology

## 2011-03-08 ENCOUNTER — Other Ambulatory Visit: Payer: Self-pay | Admitting: Obstetrics

## 2011-03-08 ENCOUNTER — Inpatient Hospital Stay (HOSPITAL_COMMUNITY): Admit: 2011-03-08 | Payer: Self-pay

## 2011-03-08 ENCOUNTER — Encounter (HOSPITAL_COMMUNITY)
Admission: RE | Disposition: A | Discharge status: Home / Self Care | Payer: Self-pay | Source: Ambulatory Visit | Attending: Obstetrics

## 2011-03-08 ENCOUNTER — Ambulatory Visit (HOSPITAL_COMMUNITY): Payer: BC Managed Care – PPO | Admitting: Anesthesiology

## 2011-03-08 ENCOUNTER — Ambulatory Visit (HOSPITAL_COMMUNITY)
Admission: RE | Admit: 2011-03-08 | Discharge: 2011-03-09 | Disposition: A | Discharge status: Home / Self Care | Payer: BC Managed Care – PPO | Source: Ambulatory Visit | Attending: Obstetrics | Admitting: Obstetrics

## 2011-03-08 DIAGNOSIS — Y9210 Unspecified residential institution as the place of occurrence of the external cause: Secondary | ICD-10-CM | POA: Insufficient documentation

## 2011-03-08 DIAGNOSIS — N949 Unspecified condition associated with female genital organs and menstrual cycle: Secondary | ICD-10-CM | POA: Insufficient documentation

## 2011-03-08 DIAGNOSIS — Z01818 Encounter for other preprocedural examination: Secondary | ICD-10-CM | POA: Insufficient documentation

## 2011-03-08 DIAGNOSIS — R0602 Shortness of breath: Secondary | ICD-10-CM | POA: Insufficient documentation

## 2011-03-08 DIAGNOSIS — T391X5A Adverse effect of 4-Aminophenol derivatives, initial encounter: Secondary | ICD-10-CM | POA: Insufficient documentation

## 2011-03-08 DIAGNOSIS — Z01812 Encounter for preprocedural laboratory examination: Secondary | ICD-10-CM | POA: Insufficient documentation

## 2011-03-08 DIAGNOSIS — D251 Intramural leiomyoma of uterus: Secondary | ICD-10-CM | POA: Insufficient documentation

## 2011-03-08 LAB — COMPREHENSIVE METABOLIC PANEL
ALT: 15 U/L (ref 0–35)
Alkaline Phosphatase: 76 U/L (ref 39–117)
BUN: 9 mg/dL (ref 6–23)
CO2: 21 mEq/L (ref 19–32)
Calcium: 9.1 mg/dL (ref 8.4–10.5)
GFR calc Af Amer: >90 mL/min (ref >90)
GFR calc non Af Amer: >90 mL/min (ref >90)
Glucose, Bld: 140 mg/dL — ABNORMAL HIGH (ref 70–99)
Sodium: 137 mEq/L (ref 135–145)

## 2011-03-08 LAB — CBC
HCT: 36.7 % (ref 36.0–46.0)
Hemoglobin: 11.7 g/dL — ABNORMAL LOW (ref 12.0–15.0)
MCH: 23.7 pg — ABNORMAL LOW (ref 26.0–34.0)
RBC: 4.94 MIL/uL (ref 3.87–5.11)

## 2011-03-08 LAB — PREGNANCY, URINE: Preg Test, Ur: NEGATIVE

## 2011-03-08 LAB — TYPE AND SCREEN

## 2011-03-08 SURGERY — ROBOTIC ASSISTED TOTAL HYSTERECTOMY
Anesthesia: General | Site: Abdomen | Wound class: Clean Contaminated

## 2011-03-08 MED ORDER — FENTANYL CITRATE 0.05 MG/ML IJ SOLN
INTRAMUSCULAR | Status: AC
  Administered 2011-03-08: 50 ug via INTRAVENOUS
  Filled 2011-03-08: qty 2

## 2011-03-08 MED ORDER — CIPROFLOXACIN IN D5W 400 MG/200ML IV SOLN
400.0000 mg | Freq: Two times a day (BID) | INTRAVENOUS | Status: DC
  Filled 2011-03-08 (×3): qty 200

## 2011-03-08 MED ORDER — DIPHENHYDRAMINE HCL 50 MG/ML IJ SOLN: 25.0000 mg | Freq: Four times a day (QID) | INTRAMUSCULAR | Status: DC | PRN

## 2011-03-08 MED ORDER — ZOLPIDEM TARTRATE 5 MG PO TABS: 5.0000 mg | ORAL_TABLET | Freq: Every evening | ORAL | Status: DC | PRN

## 2011-03-08 MED ORDER — ONDANSETRON HCL 4 MG/2ML IJ SOLN: 4.0000 mg | Freq: Four times a day (QID) | INTRAMUSCULAR | Status: DC | PRN

## 2011-03-08 MED ORDER — HYDROMORPHONE HCL 2 MG PO TABS: 4.0000 mg | ORAL_TABLET | Freq: Four times a day (QID) | ORAL | Status: DC | PRN

## 2011-03-08 MED ORDER — PROPOFOL 10 MG/ML IV EMUL
INTRAVENOUS | Status: AC
  Filled 2011-03-08: qty 40

## 2011-03-08 MED ORDER — KETOROLAC TROMETHAMINE 30 MG/ML IJ SOLN
INTRAMUSCULAR | Status: DC | PRN
  Administered 2011-03-08: 30 mg via INTRAVENOUS

## 2011-03-08 MED ORDER — ROCURONIUM BROMIDE 100 MG/10ML IV SOLN
INTRAVENOUS | Status: DC | PRN
  Administered 2011-03-08 (×2): 10 mg via INTRAVENOUS
  Administered 2011-03-08: 50 mg via INTRAVENOUS

## 2011-03-08 MED ORDER — MIDAZOLAM HCL 5 MG/5ML IJ SOLN
INTRAMUSCULAR | Status: DC | PRN
  Administered 2011-03-08: 2 mg via INTRAVENOUS

## 2011-03-08 MED ORDER — SUFENTANIL CITRATE 50 MCG/ML IV SOLN
INTRAVENOUS | Status: AC
  Filled 2011-03-08: qty 1

## 2011-03-08 MED ORDER — ROCURONIUM BROMIDE 50 MG/5ML IV SOLN
INTRAVENOUS | Status: AC
  Filled 2011-03-08: qty 1

## 2011-03-08 MED ORDER — SCOPOLAMINE 1 MG/3DAYS TD PT72: 1.0000 | MEDICATED_PATCH | Freq: Once | TRANSDERMAL | Status: DC

## 2011-03-08 MED ORDER — CIPROFLOXACIN IN D5W 400 MG/200ML IV SOLN
INTRAVENOUS | Status: DC | PRN
  Administered 2011-03-08: 400 mg via INTRAVENOUS

## 2011-03-08 MED ORDER — SCOPOLAMINE 1 MG/3DAYS TD PT72
MEDICATED_PATCH | TRANSDERMAL | Status: AC
  Administered 2011-03-08: 1.5 mg
  Filled 2011-03-08: qty 1

## 2011-03-08 MED ORDER — KETOROLAC TROMETHAMINE 30 MG/ML IJ SOLN
30.0000 mg | Freq: Four times a day (QID) | INTRAMUSCULAR | Status: DC
  Filled 2011-03-08: qty 1

## 2011-03-08 MED ORDER — SODIUM CHLORIDE 0.9 % IV SOLN
INTRAVENOUS | Status: DC
  Administered 2011-03-08 – 2011-03-09 (×2): via INTRAVENOUS

## 2011-03-08 MED ORDER — LIDOCAINE HCL (CARDIAC) 20 MG/ML IV SOLN
INTRAVENOUS | Status: AC
  Filled 2011-03-08: qty 5

## 2011-03-08 MED ORDER — DEXAMETHASONE SODIUM PHOSPHATE 10 MG/ML IJ SOLN
INTRAMUSCULAR | Status: AC
  Filled 2011-03-08: qty 1

## 2011-03-08 MED ORDER — GLYCOPYRROLATE 0.2 MG/ML IJ SOLN
INTRAMUSCULAR | Status: DC | PRN
  Administered 2011-03-08: .4 mg via INTRAVENOUS

## 2011-03-08 MED ORDER — SUFENTANIL CITRATE 50 MCG/ML IV SOLN
INTRAVENOUS | Status: DC | PRN
  Administered 2011-03-08 (×5): 10 ug via INTRAVENOUS

## 2011-03-08 MED ORDER — BISACODYL 5 MG PO TBEC
5.0000 mg | DELAYED_RELEASE_TABLET | Freq: Every day | ORAL | Status: DC | PRN
  Filled 2011-03-08: qty 1

## 2011-03-08 MED ORDER — DOCUSATE SODIUM 100 MG PO CAPS
100.0000 mg | ORAL_CAPSULE | Freq: Every day | ORAL | Status: DC
  Administered 2011-03-09: 100 mg via ORAL
  Filled 2011-03-08: qty 1

## 2011-03-08 MED ORDER — ONDANSETRON HCL 4 MG/2ML IJ SOLN
INTRAMUSCULAR | Status: DC | PRN
  Administered 2011-03-08: 4 mg via INTRAVENOUS

## 2011-03-08 MED ORDER — MENTHOL 3 MG MT LOZG: 1.0000 | LOZENGE | OROMUCOSAL | Status: DC | PRN

## 2011-03-08 MED ORDER — DEXAMETHASONE SODIUM PHOSPHATE 10 MG/ML IJ SOLN
INTRAMUSCULAR | Status: DC | PRN
  Administered 2011-03-08: 10 mg via INTRAVENOUS

## 2011-03-08 MED ORDER — FENTANYL CITRATE 0.05 MG/ML IJ SOLN
25.0000 ug | INTRAMUSCULAR | Status: DC | PRN
  Administered 2011-03-08 (×3): 50 ug via INTRAVENOUS

## 2011-03-08 MED ORDER — PROPOFOL 10 MG/ML IV EMUL
INTRAVENOUS | Status: DC | PRN
  Administered 2011-03-08: 40 mg via INTRAVENOUS
  Administered 2011-03-08: 50 mg via INTRAVENOUS
  Administered 2011-03-08: 160 mg via INTRAVENOUS

## 2011-03-08 MED ORDER — BUPIVACAINE HCL (PF) 0.25 % IJ SOLN
INTRAMUSCULAR | Status: DC | PRN
  Administered 2011-03-08: 13 mL

## 2011-03-08 MED ORDER — KETOROLAC TROMETHAMINE 30 MG/ML IJ SOLN
INTRAMUSCULAR | Status: AC
  Filled 2011-03-08: qty 1

## 2011-03-08 MED ORDER — ONDANSETRON HCL 4 MG/2ML IJ SOLN
INTRAMUSCULAR | Status: AC
  Filled 2011-03-08: qty 2

## 2011-03-08 MED ORDER — KETOROLAC TROMETHAMINE 30 MG/ML IJ SOLN
30.0000 mg | Freq: Four times a day (QID) | INTRAMUSCULAR | Status: DC
  Administered 2011-03-08 – 2011-03-09 (×3): 30 mg via INTRAVENOUS
  Filled 2011-03-08 (×2): qty 1

## 2011-03-08 MED ORDER — ALUM & MAG HYDROXIDE-SIMETH 200-200-20 MG/5ML PO SUSP: 30.0000 mL | ORAL | Status: DC | PRN

## 2011-03-08 MED ORDER — HYDROMORPHONE HCL 1 MG/ML IJ SOLN
1.0000 mg | INTRAMUSCULAR | Status: DC | PRN
  Administered 2011-03-08 – 2011-03-09 (×5): 1 mg via INTRAVENOUS
  Filled 2011-03-08 (×6): qty 1

## 2011-03-08 MED ORDER — PANTOPRAZOLE SODIUM 40 MG PO TBEC
40.0000 mg | DELAYED_RELEASE_TABLET | Freq: Every day | ORAL | Status: DC
  Filled 2011-03-08 (×3): qty 1

## 2011-03-08 MED ORDER — LACTATED RINGERS IR SOLN
Status: DC | PRN
  Administered 2011-03-08: 3000 mL

## 2011-03-08 MED ORDER — GLYCOPYRROLATE 0.2 MG/ML IJ SOLN
INTRAMUSCULAR | Status: AC
  Filled 2011-03-08: qty 2

## 2011-03-08 MED ORDER — IBUPROFEN 600 MG PO TABS: 600.0000 mg | ORAL_TABLET | Freq: Four times a day (QID) | ORAL | Status: DC | PRN

## 2011-03-08 MED ORDER — ACETAMINOPHEN 10 MG/ML IV SOLN
1000.0000 mg | Freq: Four times a day (QID) | INTRAVENOUS | Status: DC
  Administered 2011-03-08: 1000 mg via INTRAVENOUS
  Filled 2011-03-08 (×4): qty 100

## 2011-03-08 MED ORDER — NEOSTIGMINE METHYLSULFATE 1 MG/ML IJ SOLN
INTRAMUSCULAR | Status: DC | PRN
  Administered 2011-03-08: 2 mg via INTRAVENOUS

## 2011-03-08 MED ORDER — ACETAMINOPHEN 10 MG/ML IV SOLN
1000.0000 mg | Freq: Once | INTRAVENOUS | Status: AC
  Administered 2011-03-08: 1000 mg via INTRAVENOUS
  Filled 2011-03-08: qty 100

## 2011-03-08 MED ORDER — MIDAZOLAM HCL 2 MG/2ML IJ SOLN
INTRAMUSCULAR | Status: AC
  Filled 2011-03-08: qty 2

## 2011-03-08 MED ORDER — LIDOCAINE HCL (CARDIAC) 20 MG/ML IV SOLN
INTRAVENOUS | Status: DC | PRN
  Administered 2011-03-08: 60 mg via INTRAVENOUS

## 2011-03-08 MED ORDER — LACTATED RINGERS IV SOLN
INTRAVENOUS | Status: DC
  Administered 2011-03-08 (×3): via INTRAVENOUS

## 2011-03-08 MED ORDER — ONDANSETRON HCL 4 MG PO TABS: 4.0000 mg | ORAL_TABLET | Freq: Four times a day (QID) | ORAL | Status: DC | PRN

## 2011-03-08 MED ORDER — NEOSTIGMINE METHYLSULFATE 1 MG/ML IJ SOLN
INTRAMUSCULAR | Status: AC
  Filled 2011-03-08: qty 10

## 2011-03-08 SURGICAL SUPPLY — 70 items
BAG URINE DRAINAGE (UROLOGICAL SUPPLIES) ×2 IMPLANT
BARRIER ADHS 3X4 INTERCEED (GAUZE/BANDAGES/DRESSINGS) IMPLANT
BLADE LAP MORCELLATOR 15X9.5 (ELECTROSURGICAL) ×2 IMPLANT
BLADE LAPAROSCOPIC MORCELL KIT (BLADE) IMPLANT
BLADELESS LONG 8MM (BLADE) IMPLANT
BNDG COHESIVE 3X5 WHT NS (GAUZE/BANDAGES/DRESSINGS) ×2 IMPLANT
CABLE HIGH FREQUENCY MONO STRZ (ELECTRODE) ×2 IMPLANT
CATH FOLEY 3WAY  5CC 16FR (CATHETERS) ×1
CATH FOLEY 3WAY 5CC 16FR (CATHETERS) ×1 IMPLANT
CHLORAPREP W/TINT 26ML (MISCELLANEOUS) ×2 IMPLANT
CLOTH BEACON ORANGE TIMEOUT ST (SAFETY) ×2 IMPLANT
CONT PATH 16OZ SNAP LID 3702 (MISCELLANEOUS) ×2 IMPLANT
COVER MAYO STAND STRL (DRAPES) ×2 IMPLANT
COVER TABLE BACK 60X90 (DRAPES) ×4 IMPLANT
COVER TIP SHEARS 8 DVNC (MISCELLANEOUS) ×1 IMPLANT
COVER TIP SHEARS 8MM DA VINCI (MISCELLANEOUS) ×1
DECANTER SPIKE VIAL GLASS SM (MISCELLANEOUS) ×2 IMPLANT
DERMABOND ADVANCED (GAUZE/BANDAGES/DRESSINGS) ×1
DERMABOND ADVANCED .7 DNX12 (GAUZE/BANDAGES/DRESSINGS) ×1 IMPLANT
DRAPE HUG U DISPOSABLE (DRAPE) ×2 IMPLANT
DRAPE LG THREE QUARTER DISP (DRAPES) ×4 IMPLANT
DRAPE MONITOR DA VINCI (DRAPE) ×2 IMPLANT
DRAPE WARM FLUID 44X44 (DRAPE) ×2 IMPLANT
DRSG COVADERM PLUS 2X2 (GAUZE/BANDAGES/DRESSINGS) ×2 IMPLANT
ELECT REM PT RETURN 9FT ADLT (ELECTROSURGICAL) ×2
ELECTRODE REM PT RTRN 9FT ADLT (ELECTROSURGICAL) ×1 IMPLANT
EVACUATOR SMOKE 8.L (FILTER) ×2 IMPLANT
GAUZE VASELINE 3X9 (GAUZE/BANDAGES/DRESSINGS) ×2 IMPLANT
GLOVE BIO SURGEON STRL SZ 6.5 (GLOVE) ×2 IMPLANT
GLOVE BIOGEL PI IND STRL 7.0 (GLOVE) ×2 IMPLANT
GLOVE BIOGEL PI INDICATOR 7.0 (GLOVE) ×2
GOWN PREVENTION PLUS LG XLONG (DISPOSABLE) ×16 IMPLANT
IV STOPCOCK 4 WAY 40  W/Y SET (IV SOLUTION)
IV STOPCOCK 4 WAY 40 W/Y SET (IV SOLUTION) IMPLANT
KIT DISP ACCESSORY 4 ARM (KITS) ×2 IMPLANT
NEEDLE HYPO 22GX1.5 SAFETY (NEEDLE) IMPLANT
NEEDLE INSUFFLATION 14GA 120MM (NEEDLE) IMPLANT
OCCLUDER COLPOPNEUMO (BALLOONS) ×4 IMPLANT
PACK LAVH (CUSTOM PROCEDURE TRAY) ×2 IMPLANT
PAD PREP 24X48 CUFFED NSTRL (MISCELLANEOUS) ×4 IMPLANT
PLUG CATH AND CAP STER (CATHETERS) ×2 IMPLANT
POSITIONER SURGICAL ARM (MISCELLANEOUS) ×4 IMPLANT
SET IRRIG TUBING LAPAROSCOPIC (IRRIGATION / IRRIGATOR) ×2 IMPLANT
SOLUTION ELECTROLUBE (MISCELLANEOUS) ×2 IMPLANT
SPONGE LAP 18X18 X RAY DECT (DISPOSABLE) IMPLANT
STRIP CLOSURE SKIN 1/4X4 (GAUZE/BANDAGES/DRESSINGS) ×10 IMPLANT
SUT VIC AB 0 CT1 27 (SUTURE) ×5
SUT VIC AB 0 CT1 27XBRD ANBCTR (SUTURE) ×2 IMPLANT
SUT VIC AB 0 CT1 27XBRD ANTBC (SUTURE) ×3 IMPLANT
SUT VIC AB 0 CT2 27 (SUTURE) ×10 IMPLANT
SUT VIC AB 2-0 CT1 27 (SUTURE)
SUT VIC AB 2-0 CT1 TAPERPNT 27 (SUTURE) IMPLANT
SUT VIC AB 4-0 PS2 27 (SUTURE) ×4 IMPLANT
SUT VICRYL 0 UR6 27IN ABS (SUTURE) ×4 IMPLANT
SYR 50ML LL SCALE MARK (SYRINGE) ×2 IMPLANT
SYSTEM CONVERTIBLE TROCAR (TROCAR) ×2 IMPLANT
TIP UTERINE 5.1X6CM LAV DISP (MISCELLANEOUS) IMPLANT
TIP UTERINE 6.7X10CM GRN DISP (MISCELLANEOUS) ×2 IMPLANT
TIP UTERINE 6.7X6CM WHT DISP (MISCELLANEOUS) IMPLANT
TIP UTERINE 6.7X8CM BLUE DISP (MISCELLANEOUS) IMPLANT
TOWEL OR 17X24 6PK STRL BLUE (TOWEL DISPOSABLE) ×6 IMPLANT
TROCAR 12M 150ML BLUNT (TROCAR) IMPLANT
TROCAR DISP BLADELESS 8 DVNC (TROCAR) ×1 IMPLANT
TROCAR DISP BLADELESS 8MM (TROCAR) ×1
TROCAR XCEL 12X100 BLDLESS (ENDOMECHANICALS) ×2 IMPLANT
TROCAR XCEL NON-BLD 5MMX100MML (ENDOMECHANICALS) ×2 IMPLANT
TROCAR Z-THREAD BLADED 12X100M (TROCAR) ×2 IMPLANT
TUBING FILTER THERMOFLATOR (ELECTROSURGICAL) ×2 IMPLANT
WARMER LAPAROSCOPE (MISCELLANEOUS) ×2 IMPLANT
WATER STERILE IRR 1000ML POUR (IV SOLUTION) ×6 IMPLANT

## 2011-03-08 NOTE — Significant Event (Signed)
Rapid Response Event Note  Overview: Time Called: 1545 Arrival Time: 1546 Event Type: Other (Comment) (alergic reaction to IV Tylenol)  Initial Focused Assessment:   Interventions:   Event Summary:   at   Called to room by bedside RN for possible pt. Reaction to IV Tylenol. Infusion stopped right away. Pt. C/o SOB, severe RUQ abd. Pain and leg cramping. Pharmacy, Dr. Algie Coffer, RT, and Anesthesia notified. Pt. Symptoms resolved within approx. . But proceeded to exhibit same symptoms 3 more times intermittenly. Dr. Jean Rosenthal came to bedside. Pt. A&O, with continued arm and leg cramping. Labs ordered   at          Mallie Darting

## 2011-03-08 NOTE — Significant Event (Signed)
Rapid Response Event Note  Overview: Time Called: 1545 Arrival Time: 1546 Event Type: Other (Comment) (alergic reaction to IV Tylenol)  Initial Focused Assessment:   Interventions:   Event Summary:   at      at  1625 - Dr. Algie Coffer in to see pt.        Mallie Darting

## 2011-03-08 NOTE — Op Note (Signed)
Procedure:Robotic-assisted total laparoscopic hysterectomy Findings: Fibroid uterus- 607g, nl appearing cvx, nl b/l tubes and ovaries, nl liver edge. Pre-op dx: fibroids Post-op dx: same EBL: 150 cc Abx: Clinda, Cipro  Indications: This is a 45yo w/ bothersome fibroids (mass effect more bothersome than bleeding) who presents for laprascopic hysterectomy w/ retention of ovaries.  Procedure: After informed consent and discussion of alternatives to hysterectomy, the patient was taken to the operating room where general anesthesia was initiated without difficulty. She was prepped and draped in normal sterile fashion in the dorsal supine lithotomy position. A Foley catheter was inserted sterilely into the bladder. A bimanual examination was done to assess the size and position of the uterus. A weighted speculum was placed in the vagina. A vaginal wall retractor was used on the anterior vagina and  the cervix was grasped with tenaculum. The cervix was sounded with the uterine sound. It sounded to 10 cm. The cervix was assessed to identify the Rumi-Co size. A medium cup and an 10 cm shaft was used. This was easily threaded through the cervix, into the uterus and the cup seated nicely around the cervix. The uterine balloon was inflated.  Attention was then turned to the patient's abdomen. A 10 mm incision was made in the umbilicus after first infusing w/ 1/4 % marcaine. Blunt dissection was used until the fascia was identified. The fascia was grasped with Kocher clamps x 2, elevated and incised sharply. A purse string suture of 0-vicryl was placed around the fascial incision. A Hassan non bladed port was inserted after confirming proper placement with the camera. Pneumoperitoneum was created.  The abdominal wall was assessed found to be free of adhesions. The patient's abdominal wall externally was then marked with a marking pen for the planned port placement. A total of 20 cc of quarter percent Marcaine was used  to infuse at the planned port entry sites. An 8 mm skin incisions were made with the #11 blade. One left lower quadrant and 2 right lower quadrant planned port sites. Non-bladed robotics 8 mm ports were then placed under direct visualization. A 5mm non-bladed assistant port was placed in the LUQ. The robot was brought to the patient's side and attached with the right side docking. The robotic instruments were placed under direct visualization until proper placement just over the uterus.  I then went to the robotic console. Brief survey of the patient's abdomen and pelvis revealed normal findings. Bilateral ureters were identified and seen peristalsing well low in the pelvic sidewall. Bilateral tubes and ovaries were normal. The uterus was enlarged with multiple fibroids. No significant adhesions were noted. I began on the right side: the right  tube was serially cauterized with bipolar cautery with the use of the PK device and transected with the monopolar scissors. The right utero-ovarian ligament was serially cauterized with the bipolar PK and transected with the monopolar scissors. The anterior leaf of the broad ligament was dissected bluntly then monopolar cautery was used to separate the anterior and posterior leafs of the broad ligament. This was carried down through to the bladder flap. Attention was then turned to the patient's left side: the left tube was cauterized and transected,  the left uterine ovarian ligament was serially cauterized with the PK and transected with the monopolar scissors. The left broad ligament was opened up the anterior and posterior leaves were dissected apart from each other. Monopolar cautery was used to come across the anterior leaf of the left broad ligament. This dissection was  carried down across to the midline to create the bladder flap. The right uterine artery was serially cauterized with the PK and transected with the monopolar scissors. The left uterine artery and then  serially cauterized with the PK and transected with the monopolar scissors. The uterus was placed on stretch to allow better visualization of the arteries. The bladder flap was further taken down both sharply and with cautery. Cautery was used for hemostasis on several places along the bladder flap. Attention was turned to the posterior cervix and addition PK bipolar cautery was carried out.  At this point with the pressure inward on the Rumi, the colpotomy was made with the monopolar scissors anteriorly. This was carried around to the patient's right side. Additional bipolar cautery was needed on the right angle of the cuff- this was carried out with the PK bipolar. The uterus was then positioned anteriorly to allow easy access to the posterior colpotomy. This was carried out with the monopolar scissors. The posterior incision was carried around towards the patient's left. Good hemostasis was noted along the angles of the incision. The uterus was then placed on stretch to the patient's right allowing better visualization of the left angle. Again additional bipolar cautery was used on the left angle and monopolar scissors was used to complete the colpotomy.   The Rumi was removed vis the vagina and the uterus was left free in the abdomen.   Irrigation was used and the vaginal cuff appeared hemostatic. An 0 Vicryl on a CT-2 was then placed through the #3 laparoscopic port. Instruments were changed to allow a suture cut needle driver through the #1 port and and a long-tipped forceps through the #2 port. The right angle was closed in a figure-of-eight fashion. Another suture was placed and the left angle was closed with a figure-of-eight suture. 4 additional sutures were then thrown along the vaginal cuff, all figure of 8  sutures with 0-vicryl. Excellent hemostasis was noted. Suction irrigation was carried out and hemostasis was assured along the vaginal cuff. The utero-ovarian ligaments were reassessed also found  to be hemostatic.  Once the colpotomy was closed, the robot was undocked, the instruments were removed the the camera was changed to a 5mm camera. The morcellator was placed through the umbilical incision.  With the assistant manipulating the uterus into the lower pelvis to help improve visualization, the tenaculum was placed through the mocellator, used to grasp a part of the uterus and while deploying the morcellator, the uterus was brought to the morcellator and serially removed in many pieces. After each pass, the uterus was repositioned in order to grasp the uterus and morcellate in a safe position away from vital structures and pelvic wall. After all morcellation was done, small "chips" of uterine tissue were removed w/ laprascopic grasping forceps.   All free fluid was removed from the abdomen with the Nezhat suction irrigator. By standard laparoscopy the patient was placed in reverse Trendelenburg, all additional fluid was suctioned from the abdomen and pelvis the cuff was reinspected and  found to be hemostatic. All pedicles were also found to be hemostatic. Under direct visualization the ports were removed. Pneumoperitoneum was released and the umbilical port was removed. The pursestring suture at  the umbilical incision were grasped and tied. A 4-0 Vicryl was used to close the additional laparoscopic ports sites. Good hemostasis was noted.  Sponge lap and needle counts were correct x3 the patient was woken from general anesthesia having tolerated the procedure well and  taken to the recovery room in a stable fashion.  Jordyn Hofacker A. 02/19/2011 4:14 PM

## 2011-03-08 NOTE — Progress Notes (Signed)
Late Entry : Respiratory Therapy Note  Called to pt room for Rapid Response by Antony Blackbird, RN House Coverage, for possible reaction to IV Tylenol.  Arrived to find pt in pain, having some shortness of breath, and a flushed appearance.  Pt was able to speak although with some difficulty and could tell me she was short of breath.  BBS noted to be present and clear in upper and lower lobes.  Flushing dissipated after about 2 minutes.  Dr. Jean Rosenthal from Anesthesia arrived and evaluated pt.  Pt appeared to be recovering after about 20 minutes of my being there.

## 2011-03-08 NOTE — Anesthesia Preprocedure Evaluation (Signed)
Anesthesia Evaluation  Name, MR# and DOB Patient awake  General Assessment Comment  Reviewed: Allergy & Precautions, H&P , NPO status , Patient's Chart, lab work & pertinent test results, reviewed documented beta blocker date and time   Airway Mallampati: I TM Distance: >3 FB Neck ROM: full    Dental No notable dental hx. (+) Teeth Intact   Pulmonary    Pulmonary exam normal       Cardiovascular     Neuro/Psych Negative Psych ROS   GI/Hepatic negative GI ROS Neg liver ROS    Endo/Other  Negative Endocrine ROS  Renal/GU negative Renal ROS  Genitourinary negative   Musculoskeletal negative musculoskeletal ROS (+)   Abdominal Normal abdominal exam  (+)   Peds negative pediatric ROS (+)  Hematology negative hematology ROS (+)   Anesthesia Other Findings   Reproductive/Obstetrics negative OB ROS                           Anesthesia Physical Anesthesia Plan  ASA: II  Anesthesia Plan: General   Post-op Pain Management:    Induction: Intravenous  Airway Management Planned: Oral ETT  Additional Equipment:   Intra-op Plan:   Post-operative Plan: Extubation in OR  Informed Consent: I have reviewed the patients History and Physical, chart, labs and discussed the procedure including the risks, benefits and alternatives for the proposed anesthesia with the patient or authorized representative who has indicated his/her understanding and acceptance.     Plan Discussed with: CRNA  Anesthesia Plan Comments:         Anesthesia Quick Evaluation

## 2011-03-08 NOTE — Transfer of Care (Signed)
Immediate Anesthesia Transfer of Care Note  Patient: Crystal Brooks  Procedure(s) Performed:  ROBOTIC ASSISTED TOTAL HYSTERECTOMY  Patient Location: PACU  Anesthesia Type: General  Level of Consciousness: alert , oriented and sedated  Airway & Oxygen Therapy: Patient Spontanous Breathing and Patient connected to nasal cannula oxygen  Post-op Assessment: Report given to PACU RN and Post -op Vital signs reviewed and stable  Post vital signs: stable  Complications: No apparent anesthesia complications

## 2011-03-08 NOTE — H&P (Signed)
  See scanned docs under media tab.  No changes to above. Large symptomatic fibroid uterus, mass sx, all options reviewed. Plan attempted robotic assisted TLH. Pt understands operative risks  Noraa Pickeral A. 03/08/2011 7:20 AM

## 2011-03-08 NOTE — Brief Op Note (Signed)
03/08/2011  11:46 AM  PATIENT:  Crystal Brooks  45 y.o. female  PRE-OPERATIVE DIAGNOSIS:  Fibroids, Pelvic Pain  POST-OPERATIVE DIAGNOSIS:  Fibroids, Pelvic Pain  PROCEDURE:  Procedure(s): ROBOTIC ASSISTED TOTAL HYSTERECTOMY  SURGEON:  Surgeon(s): [facility]. Adlee Paar [place]ne Lavoie  PHYSICIAN ASSISTANT:   ASSISTANTS: Lavoie   ANESTHESIA:   general  EBL:  Total I/O In: 2200 [I.V.:2200] Out: 475 [Urine:325; Blood:150]  BLOOD ADMINISTERED:none CC PRBC  DRAINS: Urinary Catheter (Foley)   LOCAL MEDICATIONS USED:  MARCAINE 20CC  SPECIMEN:  Source of Specimen:  uterus and cervix  DISPOSITION OF SPECIMEN:  PATHOLOGY  COUNTS:  YES  TOURNIQUET:  * No tourniquets in log *  DICTATION: .Dragon Dictation and Note written in EPIC  PLAN OF CARE: Admit for overnight observation  PATIENT DISPOSITION:  PACU - hemodynamically stable.   Delay start of Pharmacological VTE agent (>24hrs) due to surgical blood loss or risk of bleeding:  Yes  Daksha Koone A. 03/08/2011 11:47 AM

## 2011-03-08 NOTE — Progress Notes (Signed)
Encounter addended by: Fanny Dance on: 03/08/2011  5:09 PM<BR>     Documentation filed: Notes Section

## 2011-03-08 NOTE — Significant Event (Addendum)
Rapid Response Event Note  Overview: Time Called: 1545 Arrival Time: 1546 Event Type: Other (Comment) (alergic reaction to IV Tylenol)  Initial Focused Assessment:   Interventions:   Event Summary:   at   Lungs clear. ECG normal Sinus Tach   at          Crystal Brooks

## 2011-03-08 NOTE — Anesthesia Postprocedure Evaluation (Signed)
Anesthesia Post Note  Patient: Crystal Brooks  Procedure(s) Performed:  ROBOTIC ASSISTED TOTAL HYSTERECTOMY  Anesthesia type: General  Patient location: PACU  Post pain: Pain level controlled  Post assessment: Post-op Vital signs reviewed  Last Vitals:  Filed Vitals:   03/08/11 1230  BP: 112/65  Pulse: 83  Temp: 99 F (37.2 C)  Resp: 14    Post vital signs: Reviewed  Level of consciousness: sedated  Complications: No apparent anesthesia complications

## 2011-03-08 NOTE — Anesthesia Procedure Notes (Signed)
Performed by: Finlee Milo     

## 2011-03-08 NOTE — Anesthesia Postprocedure Evaluation (Signed)
  Anesthesia Post-op Note  Patient: Crystal Brooks  Procedure(s) Performed:  ROBOTIC ASSISTED TOTAL HYSTERECTOMY  Patient Location: [facility]  Anesthesia Type: General  Level of Consciousness: alert  and oriented  Airway and Oxygen Therapy: Patient Spontanous Breathing  Post-op Pain: mild  Post-op Assessment: Patient's Cardiovascular Status Stable and Respiratory Function Stable  Post-op Vital Signs: stable  Complications: No apparent anesthesia complications

## 2011-03-08 NOTE — Progress Notes (Signed)
CTSP for "reaction" to IV Tylenol.  Pt and nurse report w/ initiation of IV Tylenol pt became short of breath, "gasping" to breath, also w/ RUQ pain that then radiated to L side. Pt also notes numbness/ tingling of legs and face. bp and pulse increased. IV tylenol infusion stopped, pt's sx improved some and pulse came down to low 100's. About 5 minutes later a second similar episode.  About 20 minutes after initial episode (and after stat evaluation by anasthesia) I saw pt. At this time pt appeared well, not diaphoretic, slightly anxious but breathing easily. Slightly pale  Stable vitals at the time of my eval other than pulse low 100's  See vitals flow  CV: RRR Pulm: CTAB Psych: anxious Abd: soft, NT, ND, incisions dressed, no bleeding GU: no bleeding vaginally (pad clean/ dry), foley draining clear urine LE: good sensation, 2+ DTR, no edema Neuro: A&O, good grip strength  Labs: CBC stable, Hgb 11.7. CMP wnl/ Cr 0.6  A/P: Reaction to IV Tylenol - pt improving, cont 1 L IVF bolus then resume 125cc/hr rate until tol reg po.  - Cont routine post-op orders. OK to trial of Dilaudid for pain - unclear etiology of reaction. Pt stable at present time, cont close monitoring.   Crystal Brooks A. 03/08/2011 5:34 PM

## 2011-03-09 ENCOUNTER — Encounter (HOSPITAL_COMMUNITY): Payer: Self-pay | Admitting: *Deleted

## 2011-03-09 MED ORDER — MEPERIDINE HCL 50 MG PO TABS
50.0000 mg | ORAL_TABLET | ORAL | Status: DC | PRN
  Administered 2011-03-09 (×2): 50 mg via ORAL
  Filled 2011-03-09 (×2): qty 1

## 2011-03-09 MED ORDER — DIPHENHYDRAMINE HCL 25 MG PO CAPS
25.0000 mg | ORAL_CAPSULE | Freq: Four times a day (QID) | ORAL | Status: DC | PRN
  Administered 2011-03-09 (×2): 25 mg via ORAL
  Filled 2011-03-09 (×2): qty 1

## 2011-03-09 MED ORDER — KETOROLAC TROMETHAMINE 10 MG PO TABS
10.0000 mg | ORAL_TABLET | Freq: Four times a day (QID) | ORAL | Status: DC | PRN
  Administered 2011-03-09: 10 mg via ORAL
  Filled 2011-03-09: qty 1

## 2011-03-09 NOTE — Progress Notes (Signed)
HD #2/ POD # 1  S: Pt notes pain overall well controlled, has been using Dilaudid and Toradol. Foley just out this am. Tol breakfast, no N/V. No vaginal bleeding. Pt notes itching this am after Dilaudid (h/o similar reaction to many narcotics in past). Pt and husband very concerned about reaction she had last night- very concerned this may happen again. No epidsodes since the one episode last pm after the IV tylenol.   O: Filed Vitals:   03/09/11 1000  BP: 104/65  Pulse: 89  Temp: 98.9 F (37.2 C)  Resp: 18    Gen: slightly anxious but overall well Skin: fine macular red rash on chest and back. Evidence of scatching. Tape reaction present CV: RRR Pulm: CTAB Abd: soft, approp tender, ND, good bowel sounds, incisions dressed, minimal bruising GU: no staining on pad LE: NT, no edema  CBC    Component Value Date/Time   WBC 13.2* 03/08/2011 1608   RBC 4.94 03/08/2011 1608   HGB 11.7* 03/08/2011 1608   HCT 36.7 03/08/2011 1608   PLT 224 03/08/2011 1608   MCV 74.3* 03/08/2011 1608   MCH 23.7* 03/08/2011 1608   MCHC 31.9 03/08/2011 1608   RDW 16.5* 03/08/2011 1608    A/P: POD#1 s/p robotic assisted TLH - post-op. Pt overall doing well. Saline lock IV, advance diet, awating void and flatus. Cont toradol and will switch to Demerol. Vitals/ exam/ CBC stable  - allergic reaction. Pt w/ reaction to IV Tynenol. Pt stable at this time, no further episodes and this IV med was d/c'd. Pt also with itching to Dilaudid- will change to Demerol and see how she does.  - Dispo. Pt very anxious about d/c this am due to reactions to meds. Will watch pt through the day and if does well d/c home later today.  Tyeesha Riker A. 03/09/2011 10:17 AM

## 2011-03-09 NOTE — Progress Notes (Signed)
D/c instructions reviewed with pt. And family, all state understanding of same,  No home equipment needed, ambulated to car with staff without incident, d/c'd home with family.

## 2011-03-09 NOTE — Significant Event (Signed)
Rapid Response Event Note  Overview: Time Called: 1545 Arrival Time: 1546 Event Type: Other (Comment) (alergic reaction to IV Tylenol)  Initial Focused Assessment:   Interventions:   Event Summary: Name of Physician Notified: Dr. Algie Coffer  at [phone]  Name of Consulting Physician Notified: Dr. Jean Rosenthal at 1600  Outcome: Stayed in room and stabalized  Event End Time: 1648  Jeannine Kitten  [id number]  Pt remains in room, stable.  VSS.  No further c/o sob, itching or rash/hives noted.

## 2011-03-11 NOTE — Discharge Summary (Signed)
PHILIPPA VESSEY MRN: [id number] DOB/AGE: Mar 11, 1966 45 y.o.  Admit date: 03/08/2011 Discharge date: 03/09/11 Admission Diagnoses: Fibroids, Pelvic Pain  Discharge Diagnoses: Fibroids, Pelvic Pain        Active Problems:  * No active hospital problems. *    Discharged Condition: stable  Hospital Course:  Uncomplicated TLH, post-op had reaction to IV tylenol where pt experienced SOB and had low bp and elevated pulse for about 10 minutes, light, fine macular rash w/ itching on back and chest, and notes sensation of tingling in lower extremities and face. This all resolved w/ stopping the IV infusion and hydration. Pt also w/ allergy and reported reactions of swelling, hives, itching to many narcotics in past. Given pt concerns about pain management and her reaction to the Tylenol, pt was observed throughout the day on POD#2 and was d/c'd late after trial of toradol and Demerol, which did well in terms of pain control and did not elicit any reactions.   Consults: none  Treatments: surgery: ROBOTIC ASSISTED TLH  Disposition: Home or Self Care   Meds: Demerol po for up to 48 hrs, Toradol nasal for up to 5 days    Signed: Kaari Zeigler A. 03/11/2011, 9:57 AM

## 2012-02-11 ENCOUNTER — Encounter: Payer: Self-pay | Admitting: Internal Medicine

## 2012-02-18 ENCOUNTER — Encounter: Payer: Self-pay | Admitting: Internal Medicine

## 2012-08-17 ENCOUNTER — Encounter: Payer: Self-pay | Admitting: Internal Medicine

## 2012-09-22 ENCOUNTER — Ambulatory Visit (AMBULATORY_SURGERY_CENTER): Payer: BC Managed Care – PPO

## 2012-09-22 VITALS — Ht 67.5 in | Wt 165.6 lb

## 2012-09-22 DIAGNOSIS — Z800 Family history of malignant neoplasm of digestive organs: Secondary | ICD-10-CM

## 2012-09-22 DIAGNOSIS — Z1211 Encounter for screening for malignant neoplasm of colon: Secondary | ICD-10-CM

## 2012-09-22 DIAGNOSIS — Z86010 Personal history of colonic polyps: Secondary | ICD-10-CM

## 2012-09-22 MED ORDER — MOVIPREP 100 G PO SOLR: ORAL | Status: DC

## 2012-09-23 ENCOUNTER — Encounter: Payer: Self-pay | Admitting: Internal Medicine

## 2012-10-06 ENCOUNTER — Encounter: Payer: BC Managed Care – PPO | Admitting: Internal Medicine

## 2012-11-10 ENCOUNTER — Encounter: Payer: Self-pay | Admitting: Internal Medicine

## 2012-11-10 ENCOUNTER — Ambulatory Visit (AMBULATORY_SURGERY_CENTER): Payer: BC Managed Care – PPO | Admitting: Internal Medicine

## 2012-11-10 VITALS — BP 101/56 | HR 69 | Temp 98.5°F | Resp 18 | Ht 67.5 in | Wt 165.0 lb

## 2012-11-10 DIAGNOSIS — Z1211 Encounter for screening for malignant neoplasm of colon: Secondary | ICD-10-CM

## 2012-11-10 DIAGNOSIS — Z800 Family history of malignant neoplasm of digestive organs: Secondary | ICD-10-CM

## 2012-11-10 DIAGNOSIS — Z860100 Personal history of colon polyps, unspecified: Secondary | ICD-10-CM

## 2012-11-10 MED ORDER — SODIUM CHLORIDE 0.9 % IV SOLN: 500.0000 mL | INTRAVENOUS | Status: DC

## 2012-11-10 NOTE — Op Note (Signed)
[facility] [address] [place], [address]   COLONOSCOPY PROCEDURE REPORT  PATIENT: Crystal Brooks, Crystal Brooks  MR#: [id number] BIRTHDATE: 1965-11-11 , 47  yrs. old GENDER: Female ENDOSCOPIST: Roxy Cedar, MD REFERRED XB:[id number] Program Recall PROCEDURE DATE:  11/10/2012 PROCEDURE:   Colonoscopy, surveillance ASA CLASS:   Class II INDICATIONS:Patient's immediate family history of colon cancer (parent 65)and Patient's personal history of adenomatous colon polyps (2003 w/ TA; F/u 2006 negative). MEDICATIONS: MAC sedation, administered by CRNA and propofol (Diprivan) 400mg  IV  DESCRIPTION OF PROCEDURE:   After the risks benefits and alternatives of the procedure were thoroughly explained, informed consent was obtained.  A digital rectal exam revealed no abnormalities of the rectum.   The LB [id number]  endoscope was introduced through the anus and advanced to the cecum, which was identified by both the appendix and ileocecal valve. No adverse events experienced.   The quality of the prep was excellent, using MoviPrep  The instrument was then slowly withdrawn as the colon was fully examined.      COLON FINDINGS: Mild diverticulosis was noted in the sigmoid colon. The colon was otherwise normal.  There was no  inflammation, polyps or cancers unless .  Retroflexed views revealed internal hemorrhoids. The time to cecum=3 minutes 19 seconds.  Withdrawal time=10 minutes 51 seconds.  The scope was withdrawn and the procedure completed.  COMPLICATIONS: There were no complications.  ENDOSCOPIC IMPRESSION: 1.   Mild diverticulosis was noted in the sigmoid colon 2.   The colon was otherwise normal  RECOMMENDATIONS: 1. Follow up colonoscopy in 5 years   eSigned:  Roxy Cedar, MD 11/10/2012 12:08 PM   cc: The Patient   PATIENT NAME:  Crystal Brooks, Crystal Brooks MR#: [id number]

## 2012-11-10 NOTE — Patient Instructions (Signed)
Mild diverticulosis.  Follow up colonoscopy in 5 years.    YOU HAD AN ENDOSCOPIC PROCEDURE TODAY AT THE [facility]: Refer to the procedure report that was given to you for any specific questions about what was found during the examination.  If the procedure report does not answer your questions, please call your gastroenterologist to clarify.  If you requested that your care partner not be given the details of your procedure findings, then the procedure report has been included in a sealed envelope for you to review at your convenience later.  YOU SHOULD EXPECT: Some feelings of bloating in the abdomen. Passage of more gas than usual.  Walking can help get rid of the air that was put into your GI tract during the procedure and reduce the bloating. If you had a lower endoscopy (such as a colonoscopy or flexible sigmoidoscopy) you may notice spotting of blood in your stool or on the toilet paper. If you underwent a bowel prep for your procedure, then you may not have a normal bowel movement for a few days.  DIET: Your first meal following the procedure should be a light meal and then it is ok to progress to your normal diet.  A half-sandwich or bowl of soup is an example of a good first meal.  Heavy or fried foods are harder to digest and may make you feel nauseous or bloated.  Likewise meals heavy in dairy and vegetables can cause extra gas to form and this can also increase the bloating.  Drink plenty of fluids but you should avoid alcoholic beverages for 24 hours.  ACTIVITY: Your care partner should take you home directly after the procedure.  You should plan to take it easy, moving slowly for the rest of the day.  You can resume normal activity the day after the procedure however you should NOT DRIVE or use heavy machinery for 24 hours (because of the sedation medicines used during the test).    SYMPTOMS TO REPORT IMMEDIATELY: A gastroenterologist can be reached at any hour.  During normal  business hours, 8:30 AM to 5:00 PM Monday through Friday, call [phone].  After hours and on weekends, please call the GI answering service at [phone] who will take a message and have the physician on call contact you.   Following lower endoscopy (colonoscopy or flexible sigmoidoscopy):  Excessive amounts of blood in the stool  Significant tenderness or worsening of abdominal pains  Swelling of the abdomen that is new, acute  Fever of 100F or higher  FOLLOW UP: If any biopsies were taken you will be contacted by phone or by letter within the next 1-3 weeks.  Call your gastroenterologist if you have not heard about the biopsies in 3 weeks.  Our staff will call the home number listed on your records the next business day following your procedure to check on you and address any questions or concerns that you may have at that time regarding the information given to you following your procedure. This is a courtesy call and so if there is no answer at the home number and we have not heard from you through the emergency physician on call, we will assume that you have returned to your regular daily activities without incident.  SIGNATURES/CONFIDENTIALITY: You and/or your care partner have signed paperwork which will be entered into your electronic medical record.  These signatures attest to the fact that that the information above on your After Visit Summary has been  reviewed and is understood.  Full responsibility of the confidentiality of this discharge information lies with you and/or your care-partner. 

## 2012-11-10 NOTE — Progress Notes (Signed)
Patient did not experience any of the following events: a burn prior to discharge; a fall within the facility; wrong site/side/patient/procedure/implant event; or a hospital transfer or hospital admission upon discharge from the facility. (G8907) Patient did not have preoperative order for IV antibiotic SSI prophylaxis. ([id number])  

## 2012-11-11 ENCOUNTER — Telehealth: Payer: Self-pay

## 2012-11-11 ENCOUNTER — Other Ambulatory Visit: Payer: Self-pay | Admitting: Dermatology

## 2012-11-11 NOTE — Telephone Encounter (Signed)
Left message to call back if any problems following procedure on Tuesday

## 2015-05-04 ENCOUNTER — Telehealth: Payer: Self-pay | Admitting: Cardiovascular Disease

## 2015-05-04 NOTE — Telephone Encounter (Signed)
05/04/2015 Received faxed referral packet from [facility] on patient for upcoming appointment with Dr. Gwenlyn Found on 05/15/2015.  Records given to [facility].  cbr

## 2015-05-15 ENCOUNTER — Encounter: Payer: Self-pay | Admitting: Cardiovascular Disease

## 2015-05-15 ENCOUNTER — Ambulatory Visit (INDEPENDENT_AMBULATORY_CARE_PROVIDER_SITE_OTHER): Payer: BLUE CROSS/BLUE SHIELD | Admitting: Cardiovascular Disease

## 2015-05-15 VITALS — BP 154/92 | HR 71 | Ht 68.0 in | Wt 176.9 lb

## 2015-05-15 DIAGNOSIS — E785 Hyperlipidemia, unspecified: Secondary | ICD-10-CM | POA: Diagnosis not present

## 2015-05-15 NOTE — Progress Notes (Signed)
     05/15/2015 Crystal Brooks   Mar 30, 1966  [id number]  Primary Physician No PCP Per Patient Primary Cardiologist: Lorretta Harp MD Renae Gloss   HPI:  Crystal Brooks is a very pleasant 49 year old mildly overweight married Caucasian female mother of one child does not work and was referred by her OB/GYN Dr. Aloha Brooks, for cardiovascular evaluation. She has no risk factors other than mildly elevated cholesterol. Her mother did have a PCI however in the past. She has never had a heart attack or stroke. She denies chest pain or shortness of breath. She had a hysterectomy approximately -4 years ago and gained 35 pounds after that and has had difficulty losing weight.   Current Outpatient Prescriptions  Medication Sig Dispense Refill  . Vitamin D, Ergocalciferol, (DRISDOL) 50000 UNITS CAPS capsule Take 1 capsule by mouth once a week. (For four (4) months)  0   No current facility-administered medications for this visit.    Allergies  Allergen Reactions  . Dilaudid [Hydromorphone Hcl] Itching and Rash  . Hydrocodone Hives  . Penicillins Hives  . Sulfa Drugs Cross Reactors Hives  . Tylenol [Acetaminophen]     Liquid Tylenol-SOB,hives, liver pain    Social History   Social History  . Marital Status: Married    Spouse Name: N/A  . Number of Children: N/A  . Years of Education: N/A   Occupational History  . Not on file.   Social History Main Topics  . Smoking status: Never Smoker   . Smokeless tobacco: Never Used  . Alcohol Use: 1.0 oz/week    2 drink(s) per week     Comment: occasionally - beer 10/month  . Drug Use: No  . Sexual Activity: Yes    Birth Control/ Protection: Condom   Other Topics Concern  . Not on file   Social History Narrative     Review of Systems: General: negative for chills, fever, night sweats or weight changes.  Cardiovascular: negative for chest pain, dyspnea on exertion, edema, orthopnea, palpitations, paroxysmal nocturnal  dyspnea or shortness of breath Dermatological: negative for rash Respiratory: negative for cough or wheezing Urologic: negative for hematuria Abdominal: negative for nausea, vomiting, diarrhea, bright red blood per rectum, melena, or hematemesis Neurologic: negative for visual changes, syncope, or dizziness All other systems reviewed and are otherwise negative except as noted above.    Blood pressure 154/92, pulse 71, height 5\' 8"  (1.727 m), weight 176 lb 14.4 oz (80.241 kg), last menstrual period 02/19/2011.  General appearance: alert and no distress Neck: no adenopathy, no carotid bruit, no JVD, supple, symmetrical, trachea midline and thyroid not enlarged, symmetric, no tenderness/mass/nodules Lungs: clear to auscultation bilaterally Heart: regular rate and rhythm, S1, S2 normal, no murmur, click, rub or gallop Extremities: extremities normal, atraumatic, no cyanosis or edema  EKG normal sinus rhythm at 71 without ST or T-wave changes. I personally reviewed this EKG  ASSESSMENT AND PLAN:   Hyperlipidemia History of hyperlipidemia with recent lipid profile performed 04/28/15 revealed a partial to 25, LDL 152 and HDL of 49. We talked about having a LDL less than 100 to me primary prevention guidelines. She prefers to try diet alone first. We will recheck a lipid liver profile in 3 months. If she is not at goal we will entertain statin therapy.      Lorretta Harp MD FACP,FACC,FAHA, [facility] 05/15/2015 4:31 PM

## 2015-05-15 NOTE — Assessment & Plan Note (Signed)
History of hyperlipidemia with recent lipid profile performed 04/28/15 revealed a partial to 25, LDL 152 and HDL of 49. We talked about having a LDL less than 100 to me primary prevention guidelines. She prefers to try diet alone first. We will recheck a lipid liver profile in 3 months. If she is not at goal we will entertain statin therapy.

## 2015-05-15 NOTE — Patient Instructions (Addendum)
Medication Instructions:  Your physician recommends that you continue on your current medications as directed. Please refer to the Current Medication list given to you today.  Labwork: Your physician recommends that you return for lab work in 3 months, fasting lipid and hepatic panel.  Testing/Procedure: Your physician has requested that you have cardiac CT. Cardiac computed tomography (CT) is a painless test that uses an x-ray machine to take clear, detailed pictures of your heart. For further information please visit [email or web address]. Please follow instruction sheet as given.  Follow-Up: We request that you follow-up in: 3 months with Dr Andria Rhein will receive a reminder letter in the mail two months in advance. If you don't receive a letter, please call our office to schedule the follow-up appointment.    If you need a refill on your cardiac medications before your next appointment, please call your pharmacy.

## 2015-05-16 ENCOUNTER — Telehealth: Payer: Self-pay | Admitting: Cardiovascular Disease

## 2015-05-16 NOTE — Telephone Encounter (Signed)
Received a call from Mack Guise scheduler at our [facility] office calling to verify which test Dr.Berry ordered 05/15/15.Spoke to [facility] he advised patient needs coronary calcium score.

## 2015-05-26 ENCOUNTER — Ambulatory Visit (INDEPENDENT_AMBULATORY_CARE_PROVIDER_SITE_OTHER)
Admission: RE | Admit: 2015-05-26 | Discharge: 2015-05-26 | Disposition: A | Discharge status: Home / Self Care | Payer: BLUE CROSS/BLUE SHIELD | Source: Ambulatory Visit | Attending: Cardiovascular Disease | Admitting: Cardiovascular Disease

## 2015-05-26 DIAGNOSIS — E785 Hyperlipidemia, unspecified: Secondary | ICD-10-CM

## 2015-06-01 ENCOUNTER — Telehealth: Payer: Self-pay | Admitting: Cardiovascular Disease

## 2015-06-01 NOTE — Telephone Encounter (Signed)
Results given, pt voiced understanding.

## 2015-06-01 NOTE — Telephone Encounter (Signed)
Pt would like her test results from 05-26-15 please.

## 2015-08-11 LAB — HEPATIC FUNCTION PANEL
ALT: 13 U/L (ref 6–29)
AST: 13 U/L (ref 10–35)
Albumin: 4.3 g/dL (ref 3.6–5.1)
Alkaline Phosphatase: 69 U/L (ref 33–130)
BILIRUBIN DIRECT: 0.1 mg/dL (ref <=0.2)
BILIRUBIN TOTAL: 0.7 mg/dL (ref 0.2–1.2)
Indirect Bilirubin: 0.6 mg/dL (ref 0.2–1.2)
Total Protein: 6.4 g/dL (ref 6.1–8.1)

## 2015-08-11 LAB — LIPID PANEL
CHOL/HDL RATIO: 4.4 ratio (ref <=5.0)
Cholesterol: 204 mg/dL — ABNORMAL HIGH (ref 125–200)
HDL: 46 mg/dL (ref >=46)
LDL Cholesterol: 137 mg/dL — ABNORMAL HIGH (ref <130)
Triglycerides: 106 mg/dL (ref <150)
VLDL: 21 mg/dL (ref <30)

## 2015-08-22 ENCOUNTER — Encounter: Payer: Self-pay | Admitting: Cardiovascular Disease

## 2015-08-22 ENCOUNTER — Ambulatory Visit (INDEPENDENT_AMBULATORY_CARE_PROVIDER_SITE_OTHER): Payer: BLUE CROSS/BLUE SHIELD | Admitting: Cardiovascular Disease

## 2015-08-22 VITALS — BP 143/80 | HR 86 | Ht 68.0 in | Wt 161.6 lb

## 2015-08-22 DIAGNOSIS — E785 Hyperlipidemia, unspecified: Secondary | ICD-10-CM

## 2015-08-22 MED ORDER — RED YEAST RICE 600 MG PO TABS: 600.0000 mg | ORAL_TABLET | Freq: Every day | ORAL | Status: DC

## 2015-08-22 NOTE — Progress Notes (Signed)
08/22/2015 Crystal Brooks   [birth year]  [id number]  Primary Physician No PCP Per Patient Primary Cardiologist: Crystal Harp MD Renae Gloss   HPI:  Crystal Brooks is a very pleasant 50 year old mildly overweight married Caucasian female mother of one child does not work and was referred by her OB/GYN Dr. Aloha Gell, for cardiovascular evaluation. I last saw her in the office 05/15/15.She has no risk factors other than mildly elevated cholesterol. Her mother did have a PCI however in the past. She has never had a heart attack or stroke. She denies chest pain or shortness of breath. She had a hysterectomy approximately -4 years ago and gained 35 pounds after that and has had difficulty losing weight. We had a discussion at her last office visit regarding risk factor modification and hyperlipidemia in particular. At that time, she preferred not to start a statin drug in essence lost 16 pounds and adjusted her diet appropriately. Her most recent lipid profile performed 08/10/15 revealed a total vessel to a 4, LDL of 137, down from 152 and HDL 46.   Current Outpatient Prescriptions  Medication Sig Dispense Refill  . Vitamin D, Ergocalciferol, (DRISDOL) 50000 UNITS CAPS capsule Take 1 capsule by mouth once a week. (For four (4) months)  0   No current facility-administered medications for this visit.    Allergies  Allergen Reactions  . Dilaudid [Hydromorphone Hcl] Itching and Rash  . Hydrocodone Hives  . Penicillins Hives  . Sulfa Drugs Cross Reactors Hives  . Tylenol [Acetaminophen]     Liquid Tylenol-SOB,hives, liver pain    Social History   Social History  . Marital Status: Married    Spouse Name: N/A  . Number of Children: N/A  . Years of Education: N/A   Occupational History  . Not on file.   Social History Main Topics  . Smoking status: Never Smoker   . Smokeless tobacco: Never Used  . Alcohol Use: 1.0 oz/week    2 drink(s) per week     Comment:  occasionally - beer 10/month  . Drug Use: No  . Sexual Activity: Yes    Birth Control/ Protection: Condom   Other Topics Concern  . Not on file   Social History Narrative     Review of Systems: General: negative for chills, fever, night sweats or weight changes.  Cardiovascular: negative for chest pain, dyspnea on exertion, edema, orthopnea, palpitations, paroxysmal nocturnal dyspnea or shortness of breath Dermatological: negative for rash Respiratory: negative for cough or wheezing Urologic: negative for hematuria Abdominal: negative for nausea, vomiting, diarrhea, bright red blood per rectum, melena, or hematemesis Neurologic: negative for visual changes, syncope, or dizziness All other systems reviewed and are otherwise negative except as noted above.    Blood pressure 143/80, pulse 86, height 5\' 8"  (1.727 m), weight 161 lb 9.6 oz (73.301 kg), last menstrual period 02/19/2011.  General appearance: alert and no distress Neck: no adenopathy, no carotid bruit, no JVD, supple, symmetrical, trachea midline and thyroid not enlarged, symmetric, no tenderness/mass/nodules Lungs: clear to auscultation bilaterally Heart: regular rate and rhythm, S1, S2 normal, no murmur, click, rub or gallop Extremities: extremities normal, atraumatic, no cyanosis or edema  EKG not performed today  ASSESSMENT AND PLAN:   Hyperlipidemia Hyperlipidemia not on statin therapy with lipid profile performed 08/10/15 revealing a total cholesterol 204, LDL 137 8 and HDL of 46. The LDL is down from 152. She does have a family history of coronary artery disease. She's lost  15 pounds and has adjusted her diet appropriately. We had a long discussion regarding statin therapy and primary prevention. As an intermediate step and decided to start her on red yeast a sterile recheck a lipid liver profile in 3 months. I will see her back after that to review the results and make further recommendations.      Crystal Harp MD FACP,FACC,FAHA, [facility] 08/22/2015 8:34 AM

## 2015-08-22 NOTE — Patient Instructions (Addendum)
Medication Instructions:  Your physician has recommended you make the following change in your medication:  1) START RED YEAST RICE 600 MG BY MOUTH DAILY.   Labwork: Your physician recommends that you return for lab work in: [place]. The lab can be found on the FIRST FLOOR of out building in [address]   Testing/Procedures: NONE  Follow-Up: Your physician wants you to follow-up in: [place], You will receive a reminder letter in the mail two months in advance. If you don't receive a letter, please call our office to schedule the follow-up appointment.   Any Other Special Instructions Will Be Listed Below (If Applicable).     If you need a refill on your cardiac medications before your next appointment, please call your pharmacy.

## 2015-08-22 NOTE — Assessment & Plan Note (Signed)
Hyperlipidemia not on statin therapy with lipid profile performed 08/10/15 revealing a total cholesterol 204, LDL 137 8 and HDL of 46. The LDL is down from 152. She does have a family history of coronary artery disease. She's lost 15 pounds and has adjusted her diet appropriately. We had a long discussion regarding statin therapy and primary prevention. As an intermediate step and decided to start her on red yeast a sterile recheck a lipid liver profile in 3 months. I will see her back after that to review the results and make further recommendations.

## 2015-10-05 DIAGNOSIS — M545 Low back pain: Secondary | ICD-10-CM | POA: Diagnosis not present

## 2015-10-05 DIAGNOSIS — M542 Cervicalgia: Secondary | ICD-10-CM | POA: Diagnosis not present

## 2015-10-05 DIAGNOSIS — M9901 Segmental and somatic dysfunction of cervical region: Secondary | ICD-10-CM | POA: Diagnosis not present

## 2015-10-05 DIAGNOSIS — M9903 Segmental and somatic dysfunction of lumbar region: Secondary | ICD-10-CM | POA: Diagnosis not present

## 2015-10-11 DIAGNOSIS — M9903 Segmental and somatic dysfunction of lumbar region: Secondary | ICD-10-CM | POA: Diagnosis not present

## 2015-10-11 DIAGNOSIS — M542 Cervicalgia: Secondary | ICD-10-CM | POA: Diagnosis not present

## 2015-10-11 DIAGNOSIS — M9901 Segmental and somatic dysfunction of cervical region: Secondary | ICD-10-CM | POA: Diagnosis not present

## 2015-10-11 DIAGNOSIS — M545 Low back pain: Secondary | ICD-10-CM | POA: Diagnosis not present

## 2015-10-19 DIAGNOSIS — M9901 Segmental and somatic dysfunction of cervical region: Secondary | ICD-10-CM | POA: Diagnosis not present

## 2015-10-19 DIAGNOSIS — M545 Low back pain: Secondary | ICD-10-CM | POA: Diagnosis not present

## 2015-10-19 DIAGNOSIS — M542 Cervicalgia: Secondary | ICD-10-CM | POA: Diagnosis not present

## 2015-10-19 DIAGNOSIS — M9903 Segmental and somatic dysfunction of lumbar region: Secondary | ICD-10-CM | POA: Diagnosis not present

## 2015-11-09 DIAGNOSIS — M545 Low back pain: Secondary | ICD-10-CM | POA: Diagnosis not present

## 2015-11-09 DIAGNOSIS — M542 Cervicalgia: Secondary | ICD-10-CM | POA: Diagnosis not present

## 2015-11-09 DIAGNOSIS — M9903 Segmental and somatic dysfunction of lumbar region: Secondary | ICD-10-CM | POA: Diagnosis not present

## 2015-11-09 DIAGNOSIS — M9901 Segmental and somatic dysfunction of cervical region: Secondary | ICD-10-CM | POA: Diagnosis not present

## 2015-11-23 DIAGNOSIS — M9901 Segmental and somatic dysfunction of cervical region: Secondary | ICD-10-CM | POA: Diagnosis not present

## 2015-11-23 DIAGNOSIS — M542 Cervicalgia: Secondary | ICD-10-CM | POA: Diagnosis not present

## 2015-11-23 DIAGNOSIS — M9903 Segmental and somatic dysfunction of lumbar region: Secondary | ICD-10-CM | POA: Diagnosis not present

## 2015-11-23 DIAGNOSIS — M545 Low back pain: Secondary | ICD-10-CM | POA: Diagnosis not present

## 2015-12-07 DIAGNOSIS — M545 Low back pain: Secondary | ICD-10-CM | POA: Diagnosis not present

## 2015-12-07 DIAGNOSIS — M542 Cervicalgia: Secondary | ICD-10-CM | POA: Diagnosis not present

## 2015-12-07 DIAGNOSIS — M9903 Segmental and somatic dysfunction of lumbar region: Secondary | ICD-10-CM | POA: Diagnosis not present

## 2015-12-07 DIAGNOSIS — M9901 Segmental and somatic dysfunction of cervical region: Secondary | ICD-10-CM | POA: Diagnosis not present

## 2015-12-29 DIAGNOSIS — E785 Hyperlipidemia, unspecified: Secondary | ICD-10-CM | POA: Diagnosis not present

## 2015-12-29 LAB — LIPID PANEL
CHOLESTEROL: 224 mg/dL — AB (ref 125–200)
HDL: 60 mg/dL (ref >=46)
LDL Cholesterol: 143 mg/dL — ABNORMAL HIGH (ref <130)
TRIGLYCERIDES: 106 mg/dL (ref <150)
Total CHOL/HDL Ratio: 3.7 Ratio (ref <=5.0)
VLDL: 21 mg/dL (ref <30)

## 2015-12-29 LAB — HEPATIC FUNCTION PANEL
ALT: 21 U/L (ref 6–29)
AST: 16 U/L (ref 10–35)
Albumin: 4.7 g/dL (ref 3.6–5.1)
Alkaline Phosphatase: 67 U/L (ref 33–130)
Bilirubin, Direct: 0.1 mg/dL (ref <=0.2)
Indirect Bilirubin: 0.5 mg/dL (ref 0.2–1.2)
TOTAL PROTEIN: 7 g/dL (ref 6.1–8.1)
Total Bilirubin: 0.6 mg/dL (ref 0.2–1.2)

## 2016-01-02 ENCOUNTER — Telehealth: Payer: Self-pay | Admitting: *Deleted

## 2016-01-02 NOTE — Progress Notes (Signed)
Sure

## 2016-01-02 NOTE — Telephone Encounter (Signed)
Attempted to reach patient to reschedule appt from Dr Gwenlyn Found to being seen in the [facility] instead. No answer. Left message to call back.    Notes Recorded by Rockne Menghini, RPH-CPP on 01/02/2016 at 7:23 AM EDT Would you like me to move her off your schedule and have her follow up with [place]? Looks like strictly primary prevention lipid patient. Won't qualify for PCSK-9 inhibitor, but we can review other options. ------  Notes Recorded by Lorretta Harp, MD on 12/30/2015 at 2:43 PM EDT Not sure that Red Yeast Rice is going to do the trick!!!!

## 2016-01-03 NOTE — Telephone Encounter (Signed)
Called patient back. Rescheduled her an appt for 01/04/16 with [facility] to discuss cholesterol. Appt with Dr Gwenlyn Found cancelled. She verbalized understanding.

## 2016-01-04 ENCOUNTER — Ambulatory Visit (INDEPENDENT_AMBULATORY_CARE_PROVIDER_SITE_OTHER): Payer: BLUE CROSS/BLUE SHIELD | Admitting: Pharmacist Clinician (PhC)/ Clinical Pharmacy Specialist

## 2016-01-04 ENCOUNTER — Encounter: Payer: Self-pay | Admitting: Pharmacist Clinician (PhC)/ Clinical Pharmacy Specialist

## 2016-01-04 VITALS — Ht 68.0 in | Wt 156.2 lb

## 2016-01-04 DIAGNOSIS — E785 Hyperlipidemia, unspecified: Secondary | ICD-10-CM

## 2016-01-04 DIAGNOSIS — M9903 Segmental and somatic dysfunction of lumbar region: Secondary | ICD-10-CM | POA: Diagnosis not present

## 2016-01-04 DIAGNOSIS — M545 Low back pain: Secondary | ICD-10-CM | POA: Diagnosis not present

## 2016-01-04 DIAGNOSIS — M9901 Segmental and somatic dysfunction of cervical region: Secondary | ICD-10-CM | POA: Diagnosis not present

## 2016-01-04 DIAGNOSIS — M542 Cervicalgia: Secondary | ICD-10-CM | POA: Diagnosis not present

## 2016-01-04 NOTE — Assessment & Plan Note (Signed)
Patient with no ASCVD and LDL of 143.  Had discussion about different types of cholesterol lowering medications.  She prefers not to take medications, but she was encouraged to work now to get this blood level down.  She is willing to try [place], as her brother takes this.  I also encouraged her to eat oatmeal and a few almonds each day.  Will repeat labs in 3 months, but suggested that if these changes don't bring her levels down, she will need to consider taking a statin.

## 2016-01-04 NOTE — Patient Instructions (Signed)
Try Cholestoff from [place] [employer] to help lower your cholesterol.  One tablet twice daily with meals.    Add in more oatmeal and almonds to your diet, they help lower cholesterol naturally.   Cholesterol Cholesterol is a fat. Your body needs a small amount of cholesterol. Cholesterol may build up in your blood vessels. This increases your chance of having a heart attack or stroke. You cannot feel your cholesterol levels. The only way to know your cholesterol level is high is with a blood test. Keep your test results. Work with your doctor to keep your cholesterol at a good level. WHAT DO THE TEST RESULTS MEAN?  Total cholesterol is how much cholesterol is in your blood.  LDL is bad cholesterol. This is the type that can build up. You want LDL to be low.  HDL is good cholesterol. It cleans your blood vessels and carries LDL away. You want HDL to be high.  Triglycerides are fat that the body can burn for energy or store. WHAT ARE GOOD LEVELS OF CHOLESTEROL?  Total cholesterol below 200.  LDL below 100 for people at risk. Below 70 for those at very high risk.  HDL above 50 is good. Above 60 is best.  Triglycerides below 150. HOW CAN I LOWER MY CHOLESTEROL?  Diet. Follow your diet programs as told by your doctor.  Choose fish, white meat chicken, roasted [place], or baked [place]. Try not to eat red meat, fried foods, or processed meats such as sausage and lunch meats.  Eat lots of fresh fruits and vegetables.  Choose whole grains, beans, pasta, potatoes, and cereals.  Use only small amounts of olive, corn, or canola oils.  Try not to eat butter, mayonnaise, shortening, or palm kernel oils.  Try not to eat foods with trans fats.  Drink skim or nonfat milk. Eat low-fat or nonfat yogurt and cheeses. Try not to drink whole milk or cream. Try not to eat ice cream, egg yolks, and full-fat cheeses.  Healthy desserts include angel food cake, ginger snaps, animal crackers, hard candy,  popsicles, and low-fat or nonfat frozen yogurt. Try not to eat pastries, cakes, pies, and cookies.  Exercise. Follow your exercise programs as told by your doctor.  Be more active. You can try gardening, walking, or taking the stairs. Ask your doctor about how you can be more active.  Medicine. Take medicine as told by your doctor.   This information is not intended to replace advice given to you by your health care provider. Make sure you discuss any questions you have with your health care provider.   Document Released: 08/09/2008 Document Revised: 06/03/2014 Document Reviewed: 02/24/2013 Elsevier Interactive Patient Education [employer].

## 2016-01-04 NOTE — Progress Notes (Signed)
01/04/2016 Tobias Alexander Tobler 22-Dec-1965 [id number]   HPI:  Crystal Brooks is a 50 y.o. female patient of Dr Gwenlyn Found, who presents today for a lipid clinic evaluation.  She originally saw Dr. Gwenlyn Found after her OB-GYN noted her elevated cholesterol levels.  Since that visit she was going to try red yeast rice, as she was not willing to take any prescription medications.  She never did start that, so her cholesterol drawn this month is a baseline with no medications.    Current Medications: none  Risk Factors: none, patient is primary prevention    Cholesterol Goals: LDL < 100   Intolerant/previously tried: has only tried dietary changes  Family history:  Both parents deceased mother at 84 with colon cancer, father at 65 with Creutzfeldt-Jacob disease.  Brother also has hyperlipidemia, she states he is controlled taking Cholestoff ([employer] plant sterol/stanol product)  Diet: eats fairly healthy, no dairy, rare red meat or pork, rare alcohol.  Had previously been told of the benefits of almonds, previously ate daily, but not recently.  Exercise: has home treadmill, walks 4-5 miles 6 days per week    Labs:  12/2015 - TC 224, TG 106, HDL 60, LDL 143 08/2015 - TC 204, TG 106, HDL 46, LDL 137  No current outpatient prescriptions on file.   No current facility-administered medications for this visit.     Allergies  Allergen Reactions  . Dilaudid [Hydromorphone Hcl] Itching and Rash  . Hydrocodone Hives  . Penicillins Hives  . Sulfa Drugs Cross Reactors Hives  . Tylenol [Acetaminophen]     Liquid Tylenol-SOB,hives, liver pain    Past Medical History:  Diagnosis Date  . Colitis   . Diverticulosis   . Fibroids   . Hyperlipidemia   . Pelvic pain in female   . PONV (postoperative nausea and vomiting)     Height 5\' 8"  (1.727 m), weight 156 lb 3.2 oz (70.9 kg), last menstrual period 02/19/2011.    Tommy Medal PharmD CPP [facility]

## 2016-01-10 ENCOUNTER — Ambulatory Visit: Payer: BLUE CROSS/BLUE SHIELD | Admitting: Cardiovascular Disease

## 2016-03-11 ENCOUNTER — Telehealth: Payer: Self-pay | Admitting: Pharmacist Clinician (PhC)/ Clinical Pharmacy Specialist

## 2016-03-11 NOTE — Telephone Encounter (Signed)
LMOM for patient to have cholesterol labs drawn in the next month.  Will set up follow up appt once the results are in

## 2016-04-08 ENCOUNTER — Other Ambulatory Visit: Payer: Self-pay | Admitting: Cardiovascular Disease

## 2016-04-08 DIAGNOSIS — E785 Hyperlipidemia, unspecified: Secondary | ICD-10-CM | POA: Diagnosis not present

## 2016-04-08 LAB — HEPATIC FUNCTION PANEL
ALBUMIN: 4.4 g/dL (ref 3.6–5.1)
ALT: 23 U/L (ref 6–29)
AST: 17 U/L (ref 10–35)
Alkaline Phosphatase: 76 U/L (ref 33–130)
BILIRUBIN DIRECT: 0.1 mg/dL (ref <=0.2)
Indirect Bilirubin: 0.6 mg/dL (ref 0.2–1.2)
TOTAL PROTEIN: 6.6 g/dL (ref 6.1–8.1)
Total Bilirubin: 0.7 mg/dL (ref 0.2–1.2)

## 2016-04-08 LAB — LIPID PANEL
CHOL/HDL RATIO: 3.6 ratio (ref <5.0)
CHOLESTEROL: 201 mg/dL — AB (ref <200)
HDL: 56 mg/dL (ref >50)
LDL Cholesterol: 125 mg/dL — ABNORMAL HIGH (ref <100)
TRIGLYCERIDES: 99 mg/dL (ref <150)
VLDL: 20 mg/dL (ref <30)

## 2016-04-09 ENCOUNTER — Telehealth: Payer: Self-pay | Admitting: Cardiovascular Disease

## 2016-04-09 NOTE — Telephone Encounter (Signed)
Left msg for patient to call. 

## 2016-04-09 NOTE — Telephone Encounter (Signed)
Crystal Brooks is calling in regards to her test results . Please cal

## 2016-04-10 ENCOUNTER — Other Ambulatory Visit: Payer: Self-pay | Admitting: Cardiovascular Disease

## 2016-04-10 DIAGNOSIS — E785 Hyperlipidemia, unspecified: Secondary | ICD-10-CM

## 2016-04-10 NOTE — Telephone Encounter (Signed)
Pt.notified

## 2016-04-10 NOTE — Telephone Encounter (Signed)
Lipids and LFT orders placed for one year. Will mail pt lab slips. Routed to scheduling for return ov.

## 2016-04-10 NOTE — Telephone Encounter (Signed)
Pt notified of result: Per result note: Excellent FLP and LFTs. Improved. Stress heart healthy diet  Pt states that she started Cholestoff as directed by [facility] at last visit.   Pt was wondering when she needs to follow up or have next labs done? Please advise

## 2016-04-10 NOTE — Telephone Encounter (Signed)
Lipid and liver profile in one year followed by return office visit time

## 2016-04-12 NOTE — Telephone Encounter (Signed)
Returning your call from 04-10-16.

## 2016-04-12 NOTE — Telephone Encounter (Signed)
Left message on pt cell phone stating that I have mailed the lab slips to have blood work done within the next year and that she does not need an appt until November 2018 per Dr. Gwenlyn Found.

## 2016-04-29 DIAGNOSIS — Z1231 Encounter for screening mammogram for malignant neoplasm of breast: Secondary | ICD-10-CM | POA: Diagnosis not present

## 2016-07-29 DIAGNOSIS — Z01419 Encounter for gynecological examination (general) (routine) without abnormal findings: Secondary | ICD-10-CM | POA: Diagnosis not present

## 2016-07-29 DIAGNOSIS — Z6824 Body mass index (BMI) 24.0-24.9, adult: Secondary | ICD-10-CM | POA: Diagnosis not present

## 2017-02-11 ENCOUNTER — Telehealth: Payer: Self-pay | Admitting: Cardiovascular Disease

## 2017-02-11 DIAGNOSIS — E785 Hyperlipidemia, unspecified: Secondary | ICD-10-CM

## 2017-02-11 NOTE — Telephone Encounter (Signed)
Lipid/liver lab(s) ordered. Mailed to patient w/instructions to have fasting labs

## 2017-02-11 NOTE — Telephone Encounter (Signed)
Pt says she needs lab work before her appt on 04-22-17. Please send her a lab order.

## 2017-04-07 DIAGNOSIS — E785 Hyperlipidemia, unspecified: Secondary | ICD-10-CM | POA: Diagnosis not present

## 2017-04-07 LAB — LIPID PANEL
Chol/HDL Ratio: 3.2 ratio (ref 0.0–4.4)
Cholesterol, Total: 194 mg/dL (ref 100–199)
HDL: 61 mg/dL (ref >39)
LDL Calculated: 112 mg/dL — ABNORMAL HIGH (ref 0–99)
Triglycerides: 106 mg/dL (ref 0–149)
VLDL Cholesterol Cal: 21 mg/dL (ref 5–40)

## 2017-04-07 LAB — HEPATIC FUNCTION PANEL
ALT: 29 IU/L (ref 0–32)
AST: 20 IU/L (ref 0–40)
Albumin: 4.6 g/dL (ref 3.5–5.5)
Alkaline Phosphatase: 79 IU/L (ref 39–117)
BILIRUBIN TOTAL: 0.6 mg/dL (ref 0.0–1.2)
Bilirubin, Direct: 0.12 mg/dL (ref 0.00–0.40)
Total Protein: 6.8 g/dL (ref 6.0–8.5)

## 2017-04-11 ENCOUNTER — Telehealth: Payer: Self-pay | Admitting: Cardiovascular Disease

## 2017-04-11 NOTE — Telephone Encounter (Signed)
New message     Pt is returning [place] call for lab results.  Leave message on voicemail with results

## 2017-04-14 NOTE — Telephone Encounter (Signed)
Gave pt results. Pt has annual f/u appt with Dr. Gwenlyn Found on 11/27. Pt verbalized thanks.

## 2017-04-22 ENCOUNTER — Ambulatory Visit: Payer: BLUE CROSS/BLUE SHIELD | Admitting: Cardiovascular Disease

## 2017-04-22 ENCOUNTER — Encounter: Payer: Self-pay | Admitting: Cardiovascular Disease

## 2017-04-22 DIAGNOSIS — E7800 Pure hypercholesterolemia, unspecified: Secondary | ICD-10-CM

## 2017-04-22 NOTE — Progress Notes (Signed)
04/22/2017 Crystal Brooks   [birth year]  [id number]  Primary Physician Patient, No Pcp Per Primary Cardiologist: Lorretta Harp MD Lupe Carney, [place]  HPI:  Crystal Brooks is a 51 y.o.  mildly overweight married Caucasian female mother of one child does not work and was referred by her OB/GYN Dr. Aloha Gell, for cardiovascular evaluation. I last saw her in the office 08/22/15.She has no risk factors other than mildly elevated cholesterol. Her mother did have a PCI however in the past. She has never had a heart attack or stroke. She denies chest pain or shortness of breath. She had a hysterectomy approximately -4 years ago and gained 35 pounds after that and has had difficulty losing weight. We had a discussion at her last office visit regarding risk factor modification and hyperlipidemia in particular. At that time, she preferred not to start a statin drug in essence lost 16 pounds and adjusted her diet appropriately. Her most recent lipid profile performed 04/07/17 revealed an LDL of 112, HDL of 61. She did have a coronary calcium is core performed 05/26/15 which was 0. She is a symptomatic.  Current Meds  Medication Sig  . Plant Sterols and Stanols (CHOLESTOFF PO) Take 2 tablets by mouth daily.     Allergies  Allergen Reactions  . Dilaudid [Hydromorphone Hcl] Itching and Rash  . Hydrocodone Hives  . Penicillins Hives  . Sulfa Drugs Cross Reactors Hives  . Tylenol [Acetaminophen]     Liquid Tylenol-SOB,hives, liver pain    Social History   Socioeconomic History  . Marital status: Married    Spouse name: Not on file  . Number of children: Not on file  . Years of education: Not on file  . Highest education level: Not on file  Social Needs  . Financial resource strain: Not on file  . Food insecurity - worry: Not on file  . Food insecurity - inability: Not on file  . Transportation needs - medical: Not on file  . Transportation needs - non-medical: Not on file    Occupational History  . Not on file  Tobacco Use  . Smoking status: Never Smoker  . Smokeless tobacco: Never Used  Substance and Sexual Activity  . Alcohol use: Yes    Alcohol/week: 1.0 oz    Types: 2 Standard drinks or equivalent per week    Comment: occasionally - beer 10/month  . Drug use: No  . Sexual activity: Yes    Birth control/protection: Condom  Other Topics Concern  . Not on file  Social History Narrative  . Not on file     Review of Systems: General: negative for chills, fever, night sweats or weight changes.  Cardiovascular: negative for chest pain, dyspnea on exertion, edema, orthopnea, palpitations, paroxysmal nocturnal dyspnea or shortness of breath Dermatological: negative for rash Respiratory: negative for cough or wheezing Urologic: negative for hematuria Abdominal: negative for nausea, vomiting, diarrhea, bright red blood per rectum, melena, or hematemesis Neurologic: negative for visual changes, syncope, or dizziness All other systems reviewed and are otherwise negative except as noted above.    Blood pressure 136/82, pulse 71, height 5\' 8"  (1.727 m), weight 167 lb 3.2 oz (75.8 kg), last menstrual period 02/19/2011.  General appearance: alert and no distress Neck: no adenopathy, no carotid bruit, no JVD, supple, symmetrical, trachea midline and thyroid not enlarged, symmetric, no tenderness/mass/nodules Lungs: clear to auscultation bilaterally Heart: regular rate and rhythm, S1, S2 normal, no murmur, click, rub or  gallop Extremities: extremities normal, atraumatic, no cyanosis or edema Pulses: 2+ and symmetric Skin: Skin color, texture, turgor normal. No rashes or lesions Neurologic: Alert and oriented X 3, normal strength and tone. Normal symmetric reflexes. Normal coordination and gait  EKG sinus rhythm at 71 without ST or T-wave changes. I personally reviewed this EKG.  ASSESSMENT AND PLAN:   Hyperlipidemia History of hyperlipidemia and  over-the-counter lipid-lowering therapy (Cholestoff) and diet with an LDL that has improved from 152 initially down to 112 as recently as 2 weeks ago. She is totally asymptomatic. She did have a coronary calcium score performed 05/26/15 which was 0.      Lorretta Harp MD FACP,FACC,FAHA, [facility] 04/22/2017 11:03 AM

## 2017-04-22 NOTE — Patient Instructions (Signed)

## 2017-04-22 NOTE — Assessment & Plan Note (Addendum)
History of hyperlipidemia and over-the-counter lipid-lowering therapy (Cholestoff) and diet with an LDL that has improved from 152 initially down to 112 as recently as 2 weeks ago. She is totally asymptomatic. She did have a coronary calcium score performed 05/26/15 which was 0.

## 2017-05-01 DIAGNOSIS — Z1231 Encounter for screening mammogram for malignant neoplasm of breast: Secondary | ICD-10-CM | POA: Diagnosis not present

## 2017-10-13 ENCOUNTER — Encounter: Payer: Self-pay | Admitting: Internal Medicine

## 2018-01-02 DIAGNOSIS — K5900 Constipation, unspecified: Secondary | ICD-10-CM | POA: Diagnosis not present

## 2018-01-02 DIAGNOSIS — Z881 Allergy status to other antibiotic agents status: Secondary | ICD-10-CM | POA: Diagnosis not present

## 2018-01-02 DIAGNOSIS — Z880 Allergy status to penicillin: Secondary | ICD-10-CM | POA: Diagnosis not present

## 2018-01-02 DIAGNOSIS — M546 Pain in thoracic spine: Secondary | ICD-10-CM | POA: Diagnosis not present

## 2018-01-02 DIAGNOSIS — M549 Dorsalgia, unspecified: Secondary | ICD-10-CM | POA: Diagnosis not present

## 2018-01-02 DIAGNOSIS — Z888 Allergy status to other drugs, medicaments and biological substances status: Secondary | ICD-10-CM | POA: Diagnosis not present

## 2018-01-02 DIAGNOSIS — J984 Other disorders of lung: Secondary | ICD-10-CM | POA: Diagnosis not present

## 2018-01-02 DIAGNOSIS — R109 Unspecified abdominal pain: Secondary | ICD-10-CM | POA: Diagnosis not present

## 2018-01-02 DIAGNOSIS — R911 Solitary pulmonary nodule: Secondary | ICD-10-CM | POA: Diagnosis not present

## 2018-01-02 DIAGNOSIS — J8410 Pulmonary fibrosis, unspecified: Secondary | ICD-10-CM | POA: Diagnosis not present

## 2018-01-02 DIAGNOSIS — Z886 Allergy status to analgesic agent status: Secondary | ICD-10-CM | POA: Diagnosis not present

## 2018-01-02 DIAGNOSIS — K269 Duodenal ulcer, unspecified as acute or chronic, without hemorrhage or perforation: Secondary | ICD-10-CM | POA: Diagnosis not present

## 2018-01-06 ENCOUNTER — Encounter: Payer: BLUE CROSS/BLUE SHIELD | Admitting: Sports Medicine

## 2018-01-07 ENCOUNTER — Encounter (INDEPENDENT_AMBULATORY_CARE_PROVIDER_SITE_OTHER): Payer: Self-pay

## 2018-01-07 ENCOUNTER — Other Ambulatory Visit (INDEPENDENT_AMBULATORY_CARE_PROVIDER_SITE_OTHER): Payer: BLUE CROSS/BLUE SHIELD

## 2018-01-07 ENCOUNTER — Encounter: Payer: Self-pay | Admitting: Nurse Practitioner

## 2018-01-07 ENCOUNTER — Ambulatory Visit: Payer: BLUE CROSS/BLUE SHIELD | Admitting: Nurse Practitioner

## 2018-01-07 VITALS — BP 130/82 | HR 64 | Ht 68.0 in | Wt 163.6 lb

## 2018-01-07 DIAGNOSIS — K269 Duodenal ulcer, unspecified as acute or chronic, without hemorrhage or perforation: Secondary | ICD-10-CM

## 2018-01-07 DIAGNOSIS — Z800 Family history of malignant neoplasm of digestive organs: Secondary | ICD-10-CM

## 2018-01-07 LAB — H. PYLORI ANTIBODY, IGG: H PYLORI IGG: POSITIVE — AB

## 2018-01-07 MED ORDER — PANTOPRAZOLE SODIUM 40 MG PO TBEC: 40.0000 mg | DELAYED_RELEASE_TABLET | Freq: Every day | ORAL | 2 refills | Status: DC

## 2018-01-07 MED ORDER — NA SULFATE-K SULFATE-MG SULF 17.5-3.13-1.6 GM/177ML PO SOLN: ORAL | 0 refills | Status: DC

## 2018-01-07 NOTE — Patient Instructions (Signed)
If you are age 54 or older, your body mass index should be between 23-30. Your Body mass index is 24.88 kg/m. If this is out of the aforementioned range listed, please consider follow up with your Primary Care Provider.  If you are age 1 or younger, your body mass index should be between 19-25. Your Body mass index is 24.88 kg/m. If this is out of the aformentioned range listed, please consider follow up with your Primary Care Provider.   You have been scheduled for an endoscopy and colonoscopy. Please follow the written instructions given to you at your visit today. Please pick up your prep supplies at the pharmacy within the next 1-3 days. If you use inhalers (even only as needed), please bring them with you on the day of your procedure. Your physician has requested that you go to www.startemmi.com and enter the access code given to you at your visit today. This web site gives a general overview about your procedure. However, you should still follow specific instructions given to you by our office regarding your preparation for the procedure.  We have sent the following medications to your pharmacy for you to pick up at your convenience: Suprep Pantoprazole 40 mg  Your provider has requested that you go to the basement level for lab work before leaving today. Press "B" on the elevator. The lab is located at the first door on the left as you exit the elevator.  Continue Pantoprazole for additional 30 days ( May not need these refills)  Call if you need a refill on Carafate.  Thank you for choosing me and [facility].   Tye Savoy, NP

## 2018-01-07 NOTE — Progress Notes (Signed)
P Primary GI:   Crystal Shorts, MD         Chief Complaint:   Abdominal pain , duodenal ulcer   IMPRESSION and PLAN:    #1. 52 yo female recently found to have large DU on CT scan ([facility]). No nsaid use. Her pain quickly resolved on acid blockers and Carafate.  -No nsaid use so consider h.pylori associated duodenal ulcer. She has never been treated for H.pylori so reasonable to check serum antibody. If positive will treat and she will need stool or breath test (off acid blockers) to ensure eradication.  -mild constipation developing, probably related to Carafate, she is going to reduced dose to  2-3 times day. Can further reduce dose if needed.  -She has a few more Protonix left from ED Rx. Will refill rx and asked her to continue PPI for another 4 weeks.  -Patient due for surveillance colonoscopy. Will add on EGD at same time to evaluate the DU on CT scan. The risks and benefits of EGD were discussed and the patient agrees to proceed.   #2.  [facility] of colon cancer in parent. She is due for surveillance colonoscopy.  -The risks and benefits of colonoscopy with possible polypectomy were discussed and the patient agrees to proceed.    HPI:     Patient is a 52 yo female, non-smoker,  known remotely to Dr. Henrene Pastor for a [facility] of colon cancer. She is due for surveillance colonoscopy but is here for for evaluation of recently diagnosed duodenal ulcer. Crystal Brooks has chronic back pain which often radiates around both sides into upper abdomen but the pain generally occurs if she lifts things. Several days ago she developed this same type pain, initially felt it was coming from her back though she hadn't been lifting anything. Food had no effect on the pain. She was nauseated but didn't vomit. The pain escalated and became constant so she presented to [facility] ED. Records reviewed in [place]. CBC was normal. Liver chemistries were normal. Lipase normal. Contrast CT scan remarkable for inflammation of  second portion of duodenal with what appeared to be a large ulcer. She was given rx for a PPI to take in am, Zantac at bedtime and Carafate QID. Her pain has totally resolved.   Review of systems:     No chest pain, no SOB, no fevers, no urinary sx.    Past Medical History:  Diagnosis Date  . Colitis   . Diverticulosis   . Fibroids   . Hyperlipidemia   . Pelvic pain in female   . PONV (postoperative nausea and vomiting)     Patient's surgical history, family medical history, social history, medications and allergies were all reviewed in Epic   Creatinine clearance cannot be calculated (Patient's most recent lab result is older than the maximum 21 days allowed.)  Current Outpatient Medications  Medication Sig Dispense Refill  . pantoprazole (PROTONIX) 20 MG tablet Take 2 tablets by mouth daily.    . Plant Sterols and Stanols (CHOLESTOFF PO) Take 2 tablets by mouth daily.    . ranitidine (ZANTAC) 150 MG tablet Take 150 mg by mouth at bedtime.    . sucralfate (CARAFATE) 1 GM/10ML suspension Take 1 g by mouth 4 (four) times daily.     No current facility-administered medications for this visit.     Physical Exam:     BP 130/82   Pulse 64   Ht 5\' 8"  (1.727 m)   Wt 163 lb 9.6  oz (74.2 kg)   LMP 02/19/2011   BMI 24.88 kg/m   GENERAL:  Pleasant female in NAD PSYCH: : Cooperative, normal affect EENT:  conjunctiva pink, mucous membranes moist, neck supple without masses CARDIAC:  RRR, no murmur heard, no peripheral edema PULM: Normal respiratory effort, lungs CTA bilaterally, no wheezing ABDOMEN:  Nondistended, soft, nontender. No obvious masses, no hepatomegaly,  normal bowel sounds SKIN:  turgor, no lesions seen Musculoskeletal:  Normal muscle tone, normal strength NEURO: Alert and oriented x 3, no focal neurologic deficits   Crystal Brooks , NP 01/07/2018, 1:52 PM

## 2018-01-09 ENCOUNTER — Encounter: Payer: Self-pay | Admitting: Nurse Practitioner

## 2018-01-09 NOTE — Progress Notes (Signed)
Assessment and plans reviewed  

## 2018-01-13 ENCOUNTER — Telehealth: Payer: Self-pay

## 2018-01-13 ENCOUNTER — Other Ambulatory Visit: Payer: Self-pay

## 2018-01-13 MED ORDER — PANTOPRAZOLE SODIUM 40 MG PO TBEC: 40.0000 mg | DELAYED_RELEASE_TABLET | Freq: Two times a day (BID) | ORAL | 0 refills | Status: DC

## 2018-01-13 MED ORDER — BIS SUBCIT-METRONID-TETRACYC 140-125-125 MG PO CAPS: 3.0000 | ORAL_CAPSULE | Freq: Three times a day (TID) | ORAL | 0 refills | Status: DC

## 2018-01-13 NOTE — Telephone Encounter (Signed)
Advised.  Endoscopy 02/26/18.

## 2018-01-13 NOTE — Telephone Encounter (Signed)
Left message for the patient to return my call.

## 2018-01-13 NOTE — Telephone Encounter (Signed)
I will check with the pharmacist for any contraindications to the individual components of Pylera. Does she continue the Zantac? Do you want the Protonix increased to BID for the duration of treatment with Pylera or change to Omeprazole 40 mg BID? Do you want her to continue Carafate? Thank you

## 2018-01-13 NOTE — Telephone Encounter (Signed)
-----   Message from Irene Shipper, MD sent at 01/13/2018 11:36 AM EDT ----- Treate with Pylera or the equivalent for 14 days, assuming no allergies. Please document in phone note for future reference. ----- Message ----- From: Greggory Keen, LPN Sent: 6/38/4665  10:18 AM EDT To: Irene Shipper, MD  Please review

## 2018-01-13 NOTE — Telephone Encounter (Signed)
Hold Zantac Protonix twice a day through the duration of therapy. Then resume previous acid suppressive regimen Stop Carafate indefinitely

## 2018-01-15 NOTE — Telephone Encounter (Signed)
Thanks

## 2018-02-26 ENCOUNTER — Ambulatory Visit (AMBULATORY_SURGERY_CENTER): Payer: BLUE CROSS/BLUE SHIELD | Admitting: Internal Medicine

## 2018-02-26 ENCOUNTER — Encounter: Payer: Self-pay | Admitting: Internal Medicine

## 2018-02-26 VITALS — BP 103/72 | HR 72 | Temp 98.4°F | Resp 16 | Ht 68.0 in | Wt 163.6 lb

## 2018-02-26 DIAGNOSIS — K269 Duodenal ulcer, unspecified as acute or chronic, without hemorrhage or perforation: Secondary | ICD-10-CM

## 2018-02-26 DIAGNOSIS — Z86010 Personal history of colonic polyps: Secondary | ICD-10-CM | POA: Diagnosis not present

## 2018-02-26 DIAGNOSIS — K2970 Gastritis, unspecified, without bleeding: Secondary | ICD-10-CM

## 2018-02-26 DIAGNOSIS — Z800 Family history of malignant neoplasm of digestive organs: Secondary | ICD-10-CM

## 2018-02-26 DIAGNOSIS — K2990 Gastroduodenitis, unspecified, without bleeding: Secondary | ICD-10-CM

## 2018-02-26 MED ORDER — SODIUM CHLORIDE 0.9 % IV SOLN: 500.0000 mL | Freq: Once | INTRAVENOUS | Status: DC

## 2018-02-26 NOTE — Op Note (Signed)
[facility] Patient Name: Crystal Brooks Procedure Date: 02/26/2018 8:00 AM MRN: [id number] Endoscopist: Docia Chuck. Henrene Pastor , MD Age: 52 Referring MD:  Date of Birth: 09-19-1965 Gender: Female Account #: [id number] Procedure:                Upper GI endoscopy, with biopsy Indications:              Epigastric abdominal pain, Exclusion of acute                            duodenal ulcer, Abnormal CT of the GI tract.                            Treated for Helicobacter pylori after positive                            serology Medicines:                Monitored Anesthesia Care Procedure:                Pre-Anesthesia Assessment:                           - Prior to the procedure, a History and Physical                            was performed, and patient medications and                            allergies were reviewed. The patient's tolerance of                            previous anesthesia was also reviewed. The risks                            and benefits of the procedure and the sedation                            options and risks were discussed with the patient.                            All questions were answered, and informed consent                            was obtained. Prior Anticoagulants: The patient has                            taken no previous anticoagulant or antiplatelet                            agents. ASA Grade Assessment: II - A patient with                            mild systemic disease. After reviewing the risks  and benefits, the patient was deemed in                            satisfactory condition to undergo the procedure.                           After obtaining informed consent, the endoscope was                            passed under direct vision. Throughout the                            procedure, the patient's blood pressure, pulse, and                            oxygen saturations were monitored  continuously. The                            Model GIF-HQ190 [id number]) scope was introduced                            through the mouth, and advanced to the second part                            of duodenum. The upper GI endoscopy was                            accomplished without difficulty. The patient                            tolerated the procedure well. Scope In: Scope Out: Findings:                 The esophagus was normal.                           The stomach was normal. Biopsies were taken with a                            cold forceps for Helicobacter pylori testing using                            CLOtest.                           The examined duodenum was normal. No active                            ulceration.                           The cardia and gastric fundus were normal on                            retroflexion except for a small hiatal hernia. Complications:  No immediate complications. Estimated Blood Loss:     Estimated blood loss: none. Impression:               - Normal esophagus.                           - Mild gastritis. Biopsied.                           - Normal examined duodenum. No active ulceration.. Recommendation:           - Patient has a contact number available for                            emergencies. The signs and symptoms of potential                            delayed complications were discussed with the                            patient. Return to normal activities tomorrow.                            Written discharge instructions were provided to the                            patient.                           - Resume previous diet.                           - Continue present medications.                           - Await pathology results. Docia Chuck. Henrene Pastor, MD 02/26/2018 8:48:45 AM This report has been signed electronically.

## 2018-02-26 NOTE — Progress Notes (Signed)
Called to room to assist during endoscopic procedure.  Patient ID and intended procedure confirmed with present staff. Received instructions for my participation in the procedure from the performing physician.  

## 2018-02-26 NOTE — Progress Notes (Signed)
Report to PACU, RN, vss, BBS= Clear.  

## 2018-02-26 NOTE — Progress Notes (Deleted)
Pt's states no medical or surgical changes since previsit or office visit. 

## 2018-02-26 NOTE — Patient Instructions (Signed)
Please read handouts provided. Continue present medications. Await pathology results.      YOU HAD AN ENDOSCOPIC PROCEDURE TODAY AT THE [facility]:   Refer to the procedure report that was given to you for any specific questions about what was found during the examination.  If the procedure report does not answer your questions, please call your gastroenterologist to clarify.  If you requested that your care partner not be given the details of your procedure findings, then the procedure report has been included in a sealed envelope for you to review at your convenience later.  YOU SHOULD EXPECT: Some feelings of bloating in the abdomen. Passage of more gas than usual.  Walking can help get rid of the air that was put into your GI tract during the procedure and reduce the bloating. If you had a lower endoscopy (such as a colonoscopy or flexible sigmoidoscopy) you may notice spotting of blood in your stool or on the toilet paper. If you underwent a bowel prep for your procedure, you may not have a normal bowel movement for a few days.  Please Note:  You might notice some irritation and congestion in your nose or some drainage.  This is from the oxygen used during your procedure.  There is no need for concern and it should clear up in a day or so.  SYMPTOMS TO REPORT IMMEDIATELY:   Following lower endoscopy (colonoscopy or flexible sigmoidoscopy):  Excessive amounts of blood in the stool  Significant tenderness or worsening of abdominal pains  Swelling of the abdomen that is new, acute  Fever of 100F or higher   Following upper endoscopy (EGD)  Vomiting of blood or coffee ground material  New chest pain or pain under the shoulder blades  Painful or persistently difficult swallowing  New shortness of breath  Fever of 100F or higher  Black, tarry-looking stools  For urgent or emergent issues, a gastroenterologist can be reached at any hour by calling [phone]  [phone].   DIET:  We do recommend a small meal at first, but then you may proceed to your regular diet.  Drink plenty of fluids but you should avoid alcoholic beverages for 24 hours.  ACTIVITY:  You should plan to take it easy for the rest of today and you should NOT DRIVE or use heavy machinery until tomorrow (because of the sedation medicines used during the test).    FOLLOW UP: Our staff will call the number listed on your records the next business day following your procedure to check on you and address any questions or concerns that you may have regarding the information given to you following your procedure. If we do not reach you, we will leave a message.  However, if you are feeling well and you are not experiencing any problems, there is no need to return our call.  We will assume that you have returned to your regular daily activities without incident.  If any biopsies were taken you will be contacted by phone or by letter within the next 1-3 weeks.  Please call us at [phone] if you have not heard about the biopsies in 3 weeks.    SIGNATURES/CONFIDENTIALITY: You and/or your care partner have signed paperwork which will be entered into your electronic medical record.  These signatures attest to the fact that that the information above on your After Visit Summary has been reviewed and is understood.  Full responsibility of the confidentiality of this discharge information lies with you   and/or your care-partner. 

## 2018-02-26 NOTE — Op Note (Signed)
[facility] Patient Name: Crystal Brooks Procedure Date: 02/26/2018 8:00 AM MRN: [id number] Endoscopist: Docia Chuck. Henrene Pastor , MD Age: 52 Referring MD:  Date of Birth: 1965-11-22 Gender: Female Account #: [id number] Procedure:                Colonoscopy Indications:              High risk colon cancer surveillance: Personal                            history of non-advanced adenoma. Also family                            history of colon cancer in parent at age 50.                            Previous colonoscopies 2003 (tubular adenoma), 2006                            (-), 2014 (-). Medicines:                Monitored Anesthesia Care Procedure:                Pre-Anesthesia Assessment:                           - Prior to the procedure, a History and Physical                            was performed, and patient medications and                            allergies were reviewed. The patient's tolerance of                            previous anesthesia was also reviewed. The risks                            and benefits of the procedure and the sedation                            options and risks were discussed with the patient.                            All questions were answered, and informed consent                            was obtained. Prior Anticoagulants: The patient has                            taken no previous anticoagulant or antiplatelet                            agents. ASA Grade Assessment: II - A patient with  mild systemic disease. After reviewing the risks                            and benefits, the patient was deemed in                            satisfactory condition to undergo the procedure.                           After obtaining informed consent, the colonoscope                            was passed under direct vision. Throughout the                            procedure, the patient's blood pressure, pulse, and                      oxygen saturations were monitored continuously. The                            Model CF-HQ190L [id number]) scope was introduced                            through the anus and advanced to the the cecum,                            identified by appendiceal orifice and ileocecal                            valve. The ileocecal valve, appendiceal orifice,                            and rectum were photographed. The quality of the                            bowel preparation was excellent. The colonoscopy                            was performed without difficulty. The patient                            tolerated the procedure well. The bowel preparation                            used was SUPREP. Scope In: 8:14:28 AM Scope Out: 8:31:05 AM Scope Withdrawal Time: 0 hours 11 minutes 55 seconds  Total Procedure Duration: 0 hours 16 minutes 37 seconds  Findings:                 Multiple diverticula were found in the sigmoid                            colon.  Internal hemorrhoids were found during retroflexion.                           The exam was otherwise without abnormality on                            direct and retroflexion views. Complications:            No immediate complications. Estimated blood loss:                            None. Estimated Blood Loss:     Estimated blood loss: none. Impression:               - Diverticulosis in the sigmoid colon.                           - Internal hemorrhoids.                           - The examination was otherwise normal on direct                            and retroflexion views.                           - No specimens collected. Recommendation:           - Repeat colonoscopy in 5 years for surveillance.                           - Patient has a contact number available for                            emergencies. The signs and symptoms of potential                            delayed complications  were discussed with the                            patient. Return to normal activities tomorrow.                            Written discharge instructions were provided to the                            patient.                           - Resume previous diet.                           - Continue present medications. Docia Chuck. Henrene Pastor, MD 02/26/2018 8:44:24 AM This report has been signed electronically.

## 2018-02-27 ENCOUNTER — Telehealth: Payer: Self-pay

## 2018-02-27 ENCOUNTER — Telehealth: Payer: Self-pay | Admitting: *Deleted

## 2018-02-27 LAB — HELICOBACTER PYLORI SCREEN-BIOPSY: UREASE: NEGATIVE

## 2018-02-27 NOTE — Telephone Encounter (Signed)
Left message on f/u call 

## 2018-02-27 NOTE — Telephone Encounter (Signed)
  Follow up Call-  Call back number 02/26/2018  Post procedure Call Back phone  # [phone]  Permission to leave phone message Yes  Some recent data might be hidden     No answer

## 2018-05-04 DIAGNOSIS — Z1231 Encounter for screening mammogram for malignant neoplasm of breast: Secondary | ICD-10-CM | POA: Diagnosis not present

## 2018-05-08 DIAGNOSIS — N6313 Unspecified lump in the right breast, lower outer quadrant: Secondary | ICD-10-CM | POA: Diagnosis not present

## 2018-05-08 DIAGNOSIS — N6001 Solitary cyst of right breast: Secondary | ICD-10-CM | POA: Diagnosis not present

## 2018-07-07 ENCOUNTER — Ambulatory Visit: Payer: BLUE CROSS/BLUE SHIELD | Admitting: Sports Medicine

## 2018-07-07 ENCOUNTER — Encounter: Payer: Self-pay | Admitting: Sports Medicine

## 2018-07-07 DIAGNOSIS — L821 Other seborrheic keratosis: Secondary | ICD-10-CM | POA: Diagnosis not present

## 2018-07-07 DIAGNOSIS — G5601 Carpal tunnel syndrome, right upper limb: Secondary | ICD-10-CM

## 2018-07-07 DIAGNOSIS — D229 Melanocytic nevi, unspecified: Secondary | ICD-10-CM | POA: Diagnosis not present

## 2018-07-07 NOTE — Assessment & Plan Note (Signed)
Has done 6 weeks of conservative measures. Nighttime splinting. At this point we are going to proceed with median nerve Hydro dissection. Return to see me in 1 month.

## 2018-07-07 NOTE — Progress Notes (Signed)
Subjective:    CC: Wrist pain  HPI:  This is a pleasant 53 year old female, for the past several months she is had increasing pain on the volar aspect of her right hand and wrist with occasional numbness and tingling running into her fingers, occasionally up her forearm to the elbow.  It is generally worse at night, she has tried NSAIDs, stretches and more recently 6 weeks of nighttime extension splinting without any improvement.  Symptoms are moderate, persistent.  No neck pain, no progressive weakness.  No trauma, no constitutional symptoms.  I reviewed the past medical history, family history, social history, surgical history, and allergies today and no changes were needed.  Please see the problem list section below in epic for further details.  Past Medical History: Past Medical History:  Diagnosis Date  . Colitis   . Diverticulosis   . Fibroids   . Hyperlipidemia   . Pelvic pain in female   . PONV (postoperative nausea and vomiting)    Past Surgical History: Past Surgical History:  Procedure Laterality Date  . COLONOSCOPY    . DILATION AND CURETTAGE OF UTERUS     x 2  . resection of meningioma     2006  . svd     x 1  . VAGINAL HYSTERECTOMY     still has ovaries   Social History: Social History   Socioeconomic History  . Marital status: Married    Spouse name: Not on file  . Number of children: Not on file  . Years of education: Not on file  . Highest education level: Not on file  Occupational History  . Not on file  Social Needs  . Financial resource strain: Not on file  . Food insecurity:    Worry: Not on file    Inability: Not on file  . Transportation needs:    Medical: Not on file    Non-medical: Not on file  Tobacco Use  . Smoking status: Never Smoker  . Smokeless tobacco: Never Used  Substance and Sexual Activity  . Alcohol use: Yes    Alcohol/week: 2.0 standard drinks    Types: 2 Standard drinks or equivalent per week    Comment: occasionally -  beer 10/month  . Drug use: No  . Sexual activity: Yes    Birth control/protection: Condom  Lifestyle  . Physical activity:    Days per week: Not on file    Minutes per session: Not on file  . Stress: Not on file  Relationships  . Social connections:    Talks on phone: Not on file    Gets together: Not on file    Attends religious service: Not on file    Active member of club or organization: Not on file    Attends meetings of clubs or organizations: Not on file    Relationship status: Not on file  Other Topics Concern  . Not on file  Social History Narrative  . Not on file   Family History: Family History  Problem Relation Age of Onset  . Colon cancer Mother   . Other Father        Creutzfeldt-Jakob disease  . Hyperlipidemia Brother    Allergies: Allergies  Allergen Reactions  . Dilaudid [Hydromorphone Hcl] Itching and Rash  . Hydrocodone Hives  . Penicillins Hives  . Sulfa Drugs Cross Reactors Hives  . Tylenol [Acetaminophen]     Liquid Tylenol-SOB,hives, liver pain   Medications: See med rec.  Review of Systems: No  headache, visual changes, nausea, vomiting, diarrhea, constipation, dizziness, abdominal pain, skin rash, fevers, chills, night sweats, swollen lymph nodes, weight loss, chest pain, body aches, joint swelling, muscle aches, shortness of breath, mood changes, visual or auditory hallucinations.  Objective:    General: Well Developed, well nourished, and in no acute distress.  Neuro: Alert and oriented x3, extra-ocular muscles intact, sensation grossly intact.  HEENT: Normocephalic, atraumatic, pupils equal round reactive to light, neck supple, no masses, no lymphadenopathy, thyroid nonpalpable.  Skin: Warm and dry, no rashes noted.  Cardiac: Regular rate and rhythm, no murmurs rubs or gallops.  Respiratory: Clear to auscultation bilaterally. Not using accessory muscles, speaking in full sentences.  Abdominal: Soft, nontender, nondistended, positive bowel  sounds, no masses, no organomegaly.  Right wrist: Inspection normal with no visible erythema or swelling. ROM smooth and normal with good flexion and extension and ulnar/radial deviation that is symmetrical with opposite wrist. Palpation is normal over metacarpals, navicular, lunate, and TFCC; tendons without tenderness/ swelling No snuffbox tenderness. No tenderness over Canal of Guyon. Strength 5/5 in all directions without pain. Only mildly positive Tinel's and phalens signs. Negative Finkelstein sign. Negative Watson's test.  Procedure: Real-time Ultrasound Guided hydrodissection of the right median nerve at the carpal tunnel Device: GE Logiq E  Verbal informed consent obtained.  Time-out conducted.  Noted no overlying erythema, induration, or other signs of local infection.  Skin prepped in a sterile fashion.  Local anesthesia: Topical Ethyl chloride.  With sterile technique and under real time ultrasound guidance: Using a 25-gauge needle advanced into the carpal tunnel, taking care to avoid intraneural injection I injected medication both superficial to and deep to the median nerve freeing it from surrounding structures, I then redirected the needle deep and injected further medication around the flexor tendons deep within the carpal tunnel for a total of 1 cc kenalog 40, 5 cc lidocaine. Completed without difficulty  Advised to call if fevers/chills, erythema, induration, drainage, or persistent bleeding.  Images permanently stored and available for review in the ultrasound unit.  Impression: Technically successful ultrasound guided median nerve hydrodissection.  Impression and Recommendations:    The patient was counselled, risk factors were discussed, anticipatory guidance given.  Carpal tunnel syndrome on right Has done 6 weeks of conservative measures. Nighttime splinting. At this point we are going to proceed with median nerve Hydro dissection. Return to see me in 1  month. ___________________________________________ Gwen Her. Dianah Field, M.D., ABFM., CAQSM. Primary Care and Sports Medicine [facility]  Adjunct Professor of [employer] [facility] [employer]

## 2018-08-05 ENCOUNTER — Other Ambulatory Visit: Payer: Self-pay

## 2018-08-05 ENCOUNTER — Encounter: Payer: Self-pay | Admitting: Sports Medicine

## 2018-08-05 ENCOUNTER — Ambulatory Visit: Payer: BLUE CROSS/BLUE SHIELD | Admitting: Sports Medicine

## 2018-08-05 ENCOUNTER — Other Ambulatory Visit: Payer: Self-pay | Admitting: Physician Assistant

## 2018-08-05 DIAGNOSIS — D485 Neoplasm of uncertain behavior of skin: Secondary | ICD-10-CM | POA: Diagnosis not present

## 2018-08-05 DIAGNOSIS — G5601 Carpal tunnel syndrome, right upper limb: Secondary | ICD-10-CM

## 2018-08-05 NOTE — Progress Notes (Signed)
Subjective:    CC: Follow-up  HPI: This is a pleasant 53 year old female, I did a median nerve hydrodissection a month ago, she is 90% better and feels as though she can live with the tiny amount of residual paresthesias, she has no more pain.  I reviewed the past medical history, family history, social history, surgical history, and allergies today and no changes were needed.  Please see the problem list section below in epic for further details.  Past Medical History: Past Medical History:  Diagnosis Date  . Colitis   . Diverticulosis   . Fibroids   . Hyperlipidemia   . Pelvic pain in female   . PONV (postoperative nausea and vomiting)    Past Surgical History: Past Surgical History:  Procedure Laterality Date  . COLONOSCOPY    . DILATION AND CURETTAGE OF UTERUS     x 2  . resection of meningioma     2006  . svd     x 1  . VAGINAL HYSTERECTOMY     still has ovaries   Social History: Social History   Socioeconomic History  . Marital status: Married    Spouse name: Not on file  . Number of children: Not on file  . Years of education: Not on file  . Highest education level: Not on file  Occupational History  . Not on file  Social Needs  . Financial resource strain: Not on file  . Food insecurity:    Worry: Not on file    Inability: Not on file  . Transportation needs:    Medical: Not on file    Non-medical: Not on file  Tobacco Use  . Smoking status: Never Smoker  . Smokeless tobacco: Never Used  Substance and Sexual Activity  . Alcohol use: Yes    Alcohol/week: 2.0 standard drinks    Types: 2 Standard drinks or equivalent per week    Comment: occasionally - beer 10/month  . Drug use: No  . Sexual activity: Yes    Birth control/protection: Condom  Lifestyle  . Physical activity:    Days per week: Not on file    Minutes per session: Not on file  . Stress: Not on file  Relationships  . Social connections:    Talks on phone: Not on file    Gets  together: Not on file    Attends religious service: Not on file    Active member of club or organization: Not on file    Attends meetings of clubs or organizations: Not on file    Relationship status: Not on file  Other Topics Concern  . Not on file  Social History Narrative  . Not on file   Family History: Family History  Problem Relation Age of Onset  . Colon cancer Mother   . Other Father        Creutzfeldt-Jakob disease  . Hyperlipidemia Brother    Allergies: Allergies  Allergen Reactions  . Dilaudid [Hydromorphone Hcl] Itching and Rash  . Hydrocodone Hives  . Penicillins Hives  . Sulfa Drugs Cross Reactors Hives  . Tylenol [Acetaminophen]     Liquid Tylenol-SOB,hives, liver pain   Medications: See med rec.  Review of Systems: No fevers, chills, night sweats, weight loss, chest pain, or shortness of breath.   Objective:    General: Well Developed, well nourished, and in no acute distress.  Neuro: Alert and oriented x3, extra-ocular muscles intact, sensation grossly intact.  HEENT: Normocephalic, atraumatic, pupils equal round reactive  to light, neck supple, no masses, no lymphadenopathy, thyroid nonpalpable.  Skin: Warm and dry, no rashes. Cardiac: Regular rate and rhythm, no murmurs rubs or gallops, no lower extremity edema.  Respiratory: Clear to auscultation bilaterally. Not using accessory muscles, speaking in full sentences.  Impression and Recommendations:    Carpal tunnel syndrome on right 90% response to me having hydrodissection, no further symptoms, she feels that she can live with a slight intermittent tingling that she has now. No pain, and no nocturnal symptoms. Return as needed, continue splint nightly.   ___________________________________________ Gwen Her. Dianah Field, M.D., ABFM., CAQSM. Primary Care and Sports Medicine [place] [facility]  Adjunct Professor of [employer] [facility] of Medicine

## 2018-08-05 NOTE — Assessment & Plan Note (Signed)
90% response to me having hydrodissection, no further symptoms, she feels that she can live with a slight intermittent tingling that she has now. No pain, and no nocturnal symptoms. Return as needed, continue splint nightly.

## 2018-09-16 DIAGNOSIS — Z01419 Encounter for gynecological examination (general) (routine) without abnormal findings: Secondary | ICD-10-CM | POA: Diagnosis not present

## 2018-09-16 DIAGNOSIS — Z0000 Encounter for general adult medical examination without abnormal findings: Secondary | ICD-10-CM | POA: Diagnosis not present

## 2018-09-16 DIAGNOSIS — Z130 Encounter for screening for diseases of the blood and blood-forming organs and certain disorders involving the immune mechanism: Secondary | ICD-10-CM | POA: Diagnosis not present

## 2018-09-16 DIAGNOSIS — Z131 Encounter for screening for diabetes mellitus: Secondary | ICD-10-CM | POA: Diagnosis not present

## 2018-09-16 DIAGNOSIS — Z13220 Encounter for screening for lipoid disorders: Secondary | ICD-10-CM | POA: Diagnosis not present

## 2018-09-16 DIAGNOSIS — Z6824 Body mass index (BMI) 24.0-24.9, adult: Secondary | ICD-10-CM | POA: Diagnosis not present

## 2018-09-16 DIAGNOSIS — Z1329 Encounter for screening for other suspected endocrine disorder: Secondary | ICD-10-CM | POA: Diagnosis not present

## 2018-11-10 DIAGNOSIS — N6001 Solitary cyst of right breast: Secondary | ICD-10-CM | POA: Diagnosis not present

## 2019-02-03 DIAGNOSIS — M5136 Other intervertebral disc degeneration, lumbar region: Secondary | ICD-10-CM | POA: Diagnosis not present

## 2019-02-03 DIAGNOSIS — M545 Low back pain: Secondary | ICD-10-CM | POA: Diagnosis not present

## 2019-02-03 DIAGNOSIS — M9903 Segmental and somatic dysfunction of lumbar region: Secondary | ICD-10-CM | POA: Diagnosis not present

## 2019-02-03 DIAGNOSIS — M9901 Segmental and somatic dysfunction of cervical region: Secondary | ICD-10-CM | POA: Diagnosis not present

## 2019-02-08 DIAGNOSIS — M9901 Segmental and somatic dysfunction of cervical region: Secondary | ICD-10-CM | POA: Diagnosis not present

## 2019-02-08 DIAGNOSIS — M9903 Segmental and somatic dysfunction of lumbar region: Secondary | ICD-10-CM | POA: Diagnosis not present

## 2019-02-08 DIAGNOSIS — M5136 Other intervertebral disc degeneration, lumbar region: Secondary | ICD-10-CM | POA: Diagnosis not present

## 2019-02-08 DIAGNOSIS — M545 Low back pain: Secondary | ICD-10-CM | POA: Diagnosis not present

## 2019-02-10 DIAGNOSIS — M9903 Segmental and somatic dysfunction of lumbar region: Secondary | ICD-10-CM | POA: Diagnosis not present

## 2019-02-10 DIAGNOSIS — M9901 Segmental and somatic dysfunction of cervical region: Secondary | ICD-10-CM | POA: Diagnosis not present

## 2019-02-10 DIAGNOSIS — M5136 Other intervertebral disc degeneration, lumbar region: Secondary | ICD-10-CM | POA: Diagnosis not present

## 2019-02-10 DIAGNOSIS — M545 Low back pain: Secondary | ICD-10-CM | POA: Diagnosis not present

## 2019-02-15 DIAGNOSIS — M9901 Segmental and somatic dysfunction of cervical region: Secondary | ICD-10-CM | POA: Diagnosis not present

## 2019-02-15 DIAGNOSIS — M9903 Segmental and somatic dysfunction of lumbar region: Secondary | ICD-10-CM | POA: Diagnosis not present

## 2019-02-15 DIAGNOSIS — M5136 Other intervertebral disc degeneration, lumbar region: Secondary | ICD-10-CM | POA: Diagnosis not present

## 2019-02-15 DIAGNOSIS — M545 Low back pain: Secondary | ICD-10-CM | POA: Diagnosis not present

## 2019-02-17 DIAGNOSIS — M9901 Segmental and somatic dysfunction of cervical region: Secondary | ICD-10-CM | POA: Diagnosis not present

## 2019-02-17 DIAGNOSIS — M5136 Other intervertebral disc degeneration, lumbar region: Secondary | ICD-10-CM | POA: Diagnosis not present

## 2019-02-17 DIAGNOSIS — M9903 Segmental and somatic dysfunction of lumbar region: Secondary | ICD-10-CM | POA: Diagnosis not present

## 2019-02-17 DIAGNOSIS — M545 Low back pain: Secondary | ICD-10-CM | POA: Diagnosis not present

## 2019-02-22 DIAGNOSIS — M9901 Segmental and somatic dysfunction of cervical region: Secondary | ICD-10-CM | POA: Diagnosis not present

## 2019-02-22 DIAGNOSIS — M545 Low back pain: Secondary | ICD-10-CM | POA: Diagnosis not present

## 2019-02-22 DIAGNOSIS — M5136 Other intervertebral disc degeneration, lumbar region: Secondary | ICD-10-CM | POA: Diagnosis not present

## 2019-02-22 DIAGNOSIS — M9903 Segmental and somatic dysfunction of lumbar region: Secondary | ICD-10-CM | POA: Diagnosis not present

## 2019-02-24 DIAGNOSIS — M9901 Segmental and somatic dysfunction of cervical region: Secondary | ICD-10-CM | POA: Diagnosis not present

## 2019-02-24 DIAGNOSIS — M545 Low back pain: Secondary | ICD-10-CM | POA: Diagnosis not present

## 2019-02-24 DIAGNOSIS — M9903 Segmental and somatic dysfunction of lumbar region: Secondary | ICD-10-CM | POA: Diagnosis not present

## 2019-02-24 DIAGNOSIS — M5136 Other intervertebral disc degeneration, lumbar region: Secondary | ICD-10-CM | POA: Diagnosis not present

## 2019-03-01 DIAGNOSIS — M9901 Segmental and somatic dysfunction of cervical region: Secondary | ICD-10-CM | POA: Diagnosis not present

## 2019-03-01 DIAGNOSIS — M545 Low back pain: Secondary | ICD-10-CM | POA: Diagnosis not present

## 2019-03-01 DIAGNOSIS — M5136 Other intervertebral disc degeneration, lumbar region: Secondary | ICD-10-CM | POA: Diagnosis not present

## 2019-03-01 DIAGNOSIS — M9903 Segmental and somatic dysfunction of lumbar region: Secondary | ICD-10-CM | POA: Diagnosis not present

## 2019-03-03 DIAGNOSIS — M9901 Segmental and somatic dysfunction of cervical region: Secondary | ICD-10-CM | POA: Diagnosis not present

## 2019-03-03 DIAGNOSIS — M5136 Other intervertebral disc degeneration, lumbar region: Secondary | ICD-10-CM | POA: Diagnosis not present

## 2019-03-03 DIAGNOSIS — M9903 Segmental and somatic dysfunction of lumbar region: Secondary | ICD-10-CM | POA: Diagnosis not present

## 2019-03-03 DIAGNOSIS — M545 Low back pain: Secondary | ICD-10-CM | POA: Diagnosis not present

## 2019-03-09 DIAGNOSIS — M5136 Other intervertebral disc degeneration, lumbar region: Secondary | ICD-10-CM | POA: Diagnosis not present

## 2019-03-09 DIAGNOSIS — M9903 Segmental and somatic dysfunction of lumbar region: Secondary | ICD-10-CM | POA: Diagnosis not present

## 2019-03-09 DIAGNOSIS — M545 Low back pain: Secondary | ICD-10-CM | POA: Diagnosis not present

## 2019-03-09 DIAGNOSIS — M9901 Segmental and somatic dysfunction of cervical region: Secondary | ICD-10-CM | POA: Diagnosis not present

## 2019-05-07 ENCOUNTER — Encounter: Payer: Self-pay | Admitting: Sports Medicine

## 2019-05-07 ENCOUNTER — Other Ambulatory Visit: Payer: Self-pay

## 2019-05-07 ENCOUNTER — Ambulatory Visit: Payer: BC Managed Care – PPO | Admitting: Sports Medicine

## 2019-05-07 DIAGNOSIS — G5601 Carpal tunnel syndrome, right upper limb: Secondary | ICD-10-CM

## 2019-05-07 NOTE — Assessment & Plan Note (Signed)
Fantastic response to median nerve hydrodissection performed in February of this year, now having a recurrence of discomfort, repeat right median nerve hydrodissection, virtual visit 1 month to evaluate efficacy.

## 2019-05-07 NOTE — Progress Notes (Signed)
Subjective:    CC: Follow-up  HPI: This is a pleasant 53 year old female, she did well after a median nerve hydrodissection back in February, now having a recurrence of discomfort, moderate, persistent, right wrist with numbness and tingling, desires repeat interventional treatment today.  I reviewed the past medical history, family history, social history, surgical history, and allergies today and no changes were needed.  Please see the problem list section below in epic for further details.  Past Medical History: Past Medical History:  Diagnosis Date  . Colitis   . Diverticulosis   . Fibroids   . Hyperlipidemia   . Pelvic pain in female   . PONV (postoperative nausea and vomiting)    Past Surgical History: Past Surgical History:  Procedure Laterality Date  . COLONOSCOPY    . DILATION AND CURETTAGE OF UTERUS     x 2  . resection of meningioma     2006  . svd     x 1  . VAGINAL HYSTERECTOMY     still has ovaries   Social History: Social History   Socioeconomic History  . Marital status: Married    Spouse name: Not on file  . Number of children: Not on file  . Years of education: Not on file  . Highest education level: Not on file  Occupational History  . Not on file  Tobacco Use  . Smoking status: Never Smoker  . Smokeless tobacco: Never Used  Substance and Sexual Activity  . Alcohol use: Yes    Alcohol/week: 2.0 standard drinks    Types: 2 Standard drinks or equivalent per week    Comment: occasionally - beer 10/month  . Drug use: No  . Sexual activity: Yes    Birth control/protection: Condom  Other Topics Concern  . Not on file  Social History Narrative  . Not on file   Social Determinants of Health   Financial Resource Strain:   . Difficulty of Paying Living Expenses: Not on file  Food Insecurity:   . Worried About Charity fundraiser in the Last Year: Not on file  . Ran Out of Food in the Last Year: Not on file  Transportation Needs:   . Lack  of Transportation (Medical): Not on file  . Lack of Transportation (Non-Medical): Not on file  Physical Activity:   . Days of Exercise per Week: Not on file  . Minutes of Exercise per Session: Not on file  Stress:   . Feeling of Stress : Not on file  Social Connections:   . Frequency of Communication with Friends and Family: Not on file  . Frequency of Social Gatherings with Friends and Family: Not on file  . Attends Religious Services: Not on file  . Active Member of Clubs or Organizations: Not on file  . Attends Archivist Meetings: Not on file  . Marital Status: Not on file   Family History: Family History  Problem Relation Age of Onset  . Colon cancer Mother   . Other Father        Creutzfeldt-Jakob disease  . Hyperlipidemia Brother    Allergies: Allergies  Allergen Reactions  . Dilaudid [Hydromorphone Hcl] Itching and Rash  . Hydrocodone Hives  . Penicillins Hives  . Sulfa Drugs Cross Reactors Hives  . Tylenol [Acetaminophen]     Liquid Tylenol-SOB,hives, liver pain   Medications: See med rec.  Review of Systems: No fevers, chills, night sweats, weight loss, chest pain, or shortness of breath.  Objective:    General: Well Developed, well nourished, and in no acute distress.  Neuro: Alert and oriented x3, extra-ocular muscles intact, sensation grossly intact.  HEENT: Normocephalic, atraumatic, pupils equal round reactive to light, neck supple, no masses, no lymphadenopathy, thyroid nonpalpable.  Skin: Warm and dry, no rashes. Cardiac: Regular rate and rhythm, no murmurs rubs or gallops, no lower extremity edema.  Respiratory: Clear to auscultation bilaterally. Not using accessory muscles, speaking in full sentences. Right wrist: Inspection normal with no visible erythema or swelling. ROM smooth and normal with good flexion and extension and ulnar/radial deviation that is symmetrical with opposite wrist. Palpation is normal over metacarpals, navicular,  lunate, and TFCC; tendons without tenderness/ swelling No snuffbox tenderness. No tenderness over Canal of Guyon. Strength 5/5 in all directions without pain. Positive tinel's and phalens signs. Negative Finkelstein sign. Negative Watson's test.  Procedure: Real-time Ultrasound Guided hydrodissection of the right median nerve at the carpal tunnel Device: Samsung HS60 Verbal informed consent obtained.  Time-out conducted.  Noted no overlying erythema, induration, or other signs of local infection.  Skin prepped in a sterile fashion.  Local anesthesia: Topical Ethyl chloride.  With sterile technique and under real time ultrasound guidance: Using a 25-gauge needle advanced into the carpal tunnel, taking care to avoid intraneural injection I injected medication both superficial to and deep to the median nerve freeing it from surrounding structures, I then redirected the needle deep and injected further medication around the flexor tendons deep within the carpal tunnel for a total of 1 cc kenalog 40, 5 cc 1% lidocaine without epinephrine. Completed without difficulty  Advised to call if fevers/chills, erythema, induration, drainage, or persistent bleeding.  Images permanently stored and available for review in the ultrasound unit.  Impression: Technically successful ultrasound guided median nerve hydrodissection.  Impression and Recommendations:    Carpal tunnel syndrome on right Fantastic response to median nerve hydrodissection performed in February of this year, now having a recurrence of discomfort, repeat right median nerve hydrodissection, virtual visit 1 month to evaluate efficacy.   ___________________________________________ Crystal Brooks. Dianah Field, M.D., ABFM., CAQSM. Primary Care and Sports Medicine [place] [facility]  Adjunct Professor of [employer] [facility] of Medicine

## 2019-05-10 DIAGNOSIS — Z1231 Encounter for screening mammogram for malignant neoplasm of breast: Secondary | ICD-10-CM | POA: Diagnosis not present

## 2019-05-14 ENCOUNTER — Telehealth: Payer: Self-pay

## 2019-05-14 DIAGNOSIS — G5601 Carpal tunnel syndrome, right upper limb: Secondary | ICD-10-CM

## 2019-05-14 NOTE — Telephone Encounter (Signed)
Patient advised. States Dr Durene Cal is her current neurologist, can he do these things for her?

## 2019-05-14 NOTE — Telephone Encounter (Signed)
Patient ok with you placing referral

## 2019-05-14 NOTE — Telephone Encounter (Signed)
Referral placed.

## 2019-05-14 NOTE — Telephone Encounter (Signed)
Symptoms should be much better at this point, if persistent pins-and-needles sensation in the same distribution need to consider a nerve conduction and EMG study and subsequently referral to hand surgery to discuss carpal tunnel release.  If okay with her I will order the nerve conduction and EMG.

## 2019-05-14 NOTE — Telephone Encounter (Signed)
Crystal Brooks is a Publishing rights manager. I don't know if does that procedure.  I am happy to refer her to him if she would like.

## 2019-05-14 NOTE — Telephone Encounter (Signed)
Patient called stating she had injection on 05/07/19 and is still having pins and needles sensation as well as numbness in her hand, fingers, and wrist. She is wondering if SX should have resolved by now

## 2019-05-28 HISTORY — PX: CARPAL TUNNEL RELEASE: SHX101

## 2019-06-04 ENCOUNTER — Ambulatory Visit: Payer: BC Managed Care – PPO | Admitting: Sports Medicine

## 2019-06-08 DIAGNOSIS — G5601 Carpal tunnel syndrome, right upper limb: Secondary | ICD-10-CM | POA: Diagnosis not present

## 2019-06-18 DIAGNOSIS — G5603 Carpal tunnel syndrome, bilateral upper limbs: Secondary | ICD-10-CM | POA: Diagnosis not present

## 2019-07-22 ENCOUNTER — Other Ambulatory Visit: Payer: Self-pay | Admitting: Physician Assistant

## 2019-07-22 DIAGNOSIS — L820 Inflamed seborrheic keratosis: Secondary | ICD-10-CM | POA: Diagnosis not present

## 2019-07-22 DIAGNOSIS — L738 Other specified follicular disorders: Secondary | ICD-10-CM | POA: Diagnosis not present

## 2019-07-22 DIAGNOSIS — D229 Melanocytic nevi, unspecified: Secondary | ICD-10-CM | POA: Diagnosis not present

## 2019-07-22 DIAGNOSIS — D485 Neoplasm of uncertain behavior of skin: Secondary | ICD-10-CM | POA: Diagnosis not present

## 2019-08-30 DIAGNOSIS — M545 Low back pain: Secondary | ICD-10-CM | POA: Diagnosis not present

## 2019-08-30 DIAGNOSIS — M9901 Segmental and somatic dysfunction of cervical region: Secondary | ICD-10-CM | POA: Diagnosis not present

## 2019-08-30 DIAGNOSIS — M9903 Segmental and somatic dysfunction of lumbar region: Secondary | ICD-10-CM | POA: Diagnosis not present

## 2019-08-30 DIAGNOSIS — M5136 Other intervertebral disc degeneration, lumbar region: Secondary | ICD-10-CM | POA: Diagnosis not present

## 2019-09-01 DIAGNOSIS — M5136 Other intervertebral disc degeneration, lumbar region: Secondary | ICD-10-CM | POA: Diagnosis not present

## 2019-09-01 DIAGNOSIS — G5601 Carpal tunnel syndrome, right upper limb: Secondary | ICD-10-CM | POA: Diagnosis not present

## 2019-09-01 DIAGNOSIS — M545 Low back pain: Secondary | ICD-10-CM | POA: Diagnosis not present

## 2019-09-01 DIAGNOSIS — M9903 Segmental and somatic dysfunction of lumbar region: Secondary | ICD-10-CM | POA: Diagnosis not present

## 2019-09-01 DIAGNOSIS — M9901 Segmental and somatic dysfunction of cervical region: Secondary | ICD-10-CM | POA: Diagnosis not present

## 2019-09-08 DIAGNOSIS — G5601 Carpal tunnel syndrome, right upper limb: Secondary | ICD-10-CM | POA: Diagnosis not present

## 2019-09-16 ENCOUNTER — Encounter: Payer: BC Managed Care – PPO | Admitting: Physician Assistant

## 2019-11-12 ENCOUNTER — Encounter: Payer: BC Managed Care – PPO | Admitting: Physician Assistant

## 2019-11-28 DIAGNOSIS — X509XXA Other and unspecified overexertion or strenuous movements or postures, initial encounter: Secondary | ICD-10-CM | POA: Diagnosis not present

## 2019-11-28 DIAGNOSIS — Z880 Allergy status to penicillin: Secondary | ICD-10-CM | POA: Diagnosis not present

## 2019-11-28 DIAGNOSIS — Z885 Allergy status to narcotic agent status: Secondary | ICD-10-CM | POA: Diagnosis not present

## 2019-11-28 DIAGNOSIS — S299XXA Unspecified injury of thorax, initial encounter: Secondary | ICD-10-CM | POA: Diagnosis not present

## 2019-11-28 DIAGNOSIS — S20212A Contusion of left front wall of thorax, initial encounter: Secondary | ICD-10-CM | POA: Diagnosis not present

## 2019-11-28 DIAGNOSIS — Z882 Allergy status to sulfonamides status: Secondary | ICD-10-CM | POA: Diagnosis not present

## 2019-11-28 DIAGNOSIS — Z79899 Other long term (current) drug therapy: Secondary | ICD-10-CM | POA: Diagnosis not present

## 2019-11-28 DIAGNOSIS — G8911 Acute pain due to trauma: Secondary | ICD-10-CM | POA: Diagnosis not present

## 2019-12-09 ENCOUNTER — Other Ambulatory Visit (HOSPITAL_COMMUNITY): Payer: Self-pay | Admitting: Neurosurgery

## 2019-12-09 ENCOUNTER — Other Ambulatory Visit: Payer: Self-pay | Admitting: Neurosurgery

## 2019-12-09 DIAGNOSIS — Z9889 Other specified postprocedural states: Secondary | ICD-10-CM

## 2019-12-27 ENCOUNTER — Other Ambulatory Visit: Payer: Self-pay

## 2019-12-27 ENCOUNTER — Ambulatory Visit (HOSPITAL_COMMUNITY)
Admission: RE | Admit: 2019-12-27 | Discharge: 2019-12-27 | Disposition: A | Discharge status: Home / Self Care | Payer: BC Managed Care – PPO | Source: Ambulatory Visit | Attending: Neurosurgery | Admitting: Neurosurgery

## 2019-12-27 DIAGNOSIS — Z9889 Other specified postprocedural states: Secondary | ICD-10-CM | POA: Insufficient documentation

## 2019-12-27 DIAGNOSIS — R200 Anesthesia of skin: Secondary | ICD-10-CM | POA: Diagnosis not present

## 2020-01-19 DIAGNOSIS — M545 Low back pain: Secondary | ICD-10-CM | POA: Diagnosis not present

## 2020-01-19 DIAGNOSIS — M5136 Other intervertebral disc degeneration, lumbar region: Secondary | ICD-10-CM | POA: Diagnosis not present

## 2020-01-19 DIAGNOSIS — M9903 Segmental and somatic dysfunction of lumbar region: Secondary | ICD-10-CM | POA: Diagnosis not present

## 2020-01-19 DIAGNOSIS — M9901 Segmental and somatic dysfunction of cervical region: Secondary | ICD-10-CM | POA: Diagnosis not present

## 2020-01-25 DIAGNOSIS — M9903 Segmental and somatic dysfunction of lumbar region: Secondary | ICD-10-CM | POA: Diagnosis not present

## 2020-01-25 DIAGNOSIS — M5136 Other intervertebral disc degeneration, lumbar region: Secondary | ICD-10-CM | POA: Diagnosis not present

## 2020-01-25 DIAGNOSIS — M545 Low back pain: Secondary | ICD-10-CM | POA: Diagnosis not present

## 2020-01-25 DIAGNOSIS — M9901 Segmental and somatic dysfunction of cervical region: Secondary | ICD-10-CM | POA: Diagnosis not present

## 2020-01-27 DIAGNOSIS — M545 Low back pain: Secondary | ICD-10-CM | POA: Diagnosis not present

## 2020-01-27 DIAGNOSIS — M9903 Segmental and somatic dysfunction of lumbar region: Secondary | ICD-10-CM | POA: Diagnosis not present

## 2020-01-27 DIAGNOSIS — M9901 Segmental and somatic dysfunction of cervical region: Secondary | ICD-10-CM | POA: Diagnosis not present

## 2020-01-27 DIAGNOSIS — M5136 Other intervertebral disc degeneration, lumbar region: Secondary | ICD-10-CM | POA: Diagnosis not present

## 2020-02-03 DIAGNOSIS — M5136 Other intervertebral disc degeneration, lumbar region: Secondary | ICD-10-CM | POA: Diagnosis not present

## 2020-02-03 DIAGNOSIS — M9903 Segmental and somatic dysfunction of lumbar region: Secondary | ICD-10-CM | POA: Diagnosis not present

## 2020-02-03 DIAGNOSIS — M9901 Segmental and somatic dysfunction of cervical region: Secondary | ICD-10-CM | POA: Diagnosis not present

## 2020-02-03 DIAGNOSIS — M545 Low back pain: Secondary | ICD-10-CM | POA: Diagnosis not present

## 2020-02-08 DIAGNOSIS — M9903 Segmental and somatic dysfunction of lumbar region: Secondary | ICD-10-CM | POA: Diagnosis not present

## 2020-02-08 DIAGNOSIS — M545 Low back pain: Secondary | ICD-10-CM | POA: Diagnosis not present

## 2020-02-08 DIAGNOSIS — M9901 Segmental and somatic dysfunction of cervical region: Secondary | ICD-10-CM | POA: Diagnosis not present

## 2020-02-08 DIAGNOSIS — M5136 Other intervertebral disc degeneration, lumbar region: Secondary | ICD-10-CM | POA: Diagnosis not present

## 2020-02-15 DIAGNOSIS — M9901 Segmental and somatic dysfunction of cervical region: Secondary | ICD-10-CM | POA: Diagnosis not present

## 2020-02-15 DIAGNOSIS — M545 Low back pain: Secondary | ICD-10-CM | POA: Diagnosis not present

## 2020-02-15 DIAGNOSIS — M5136 Other intervertebral disc degeneration, lumbar region: Secondary | ICD-10-CM | POA: Diagnosis not present

## 2020-02-15 DIAGNOSIS — M9903 Segmental and somatic dysfunction of lumbar region: Secondary | ICD-10-CM | POA: Diagnosis not present

## 2020-03-08 DIAGNOSIS — M545 Low back pain, unspecified: Secondary | ICD-10-CM | POA: Diagnosis not present

## 2020-03-08 DIAGNOSIS — M9901 Segmental and somatic dysfunction of cervical region: Secondary | ICD-10-CM | POA: Diagnosis not present

## 2020-03-08 DIAGNOSIS — M5136 Other intervertebral disc degeneration, lumbar region: Secondary | ICD-10-CM | POA: Diagnosis not present

## 2020-03-08 DIAGNOSIS — M9903 Segmental and somatic dysfunction of lumbar region: Secondary | ICD-10-CM | POA: Diagnosis not present

## 2020-03-16 DIAGNOSIS — M5136 Other intervertebral disc degeneration, lumbar region: Secondary | ICD-10-CM | POA: Diagnosis not present

## 2020-03-16 DIAGNOSIS — M545 Low back pain, unspecified: Secondary | ICD-10-CM | POA: Diagnosis not present

## 2020-03-16 DIAGNOSIS — M9903 Segmental and somatic dysfunction of lumbar region: Secondary | ICD-10-CM | POA: Diagnosis not present

## 2020-03-16 DIAGNOSIS — M9901 Segmental and somatic dysfunction of cervical region: Secondary | ICD-10-CM | POA: Diagnosis not present

## 2020-05-05 ENCOUNTER — Other Ambulatory Visit: Payer: Self-pay

## 2020-05-05 ENCOUNTER — Ambulatory Visit (INDEPENDENT_AMBULATORY_CARE_PROVIDER_SITE_OTHER): Payer: BC Managed Care – PPO

## 2020-05-05 ENCOUNTER — Ambulatory Visit (INDEPENDENT_AMBULATORY_CARE_PROVIDER_SITE_OTHER): Payer: BC Managed Care – PPO | Admitting: Sports Medicine

## 2020-05-05 DIAGNOSIS — M51369 Other intervertebral disc degeneration, lumbar region without mention of lumbar back pain or lower extremity pain: Secondary | ICD-10-CM | POA: Insufficient documentation

## 2020-05-05 DIAGNOSIS — M4186 Other forms of scoliosis, lumbar region: Secondary | ICD-10-CM

## 2020-05-05 DIAGNOSIS — M533 Sacrococcygeal disorders, not elsewhere classified: Secondary | ICD-10-CM

## 2020-05-05 DIAGNOSIS — G8929 Other chronic pain: Secondary | ICD-10-CM

## 2020-05-05 DIAGNOSIS — M545 Low back pain, unspecified: Secondary | ICD-10-CM

## 2020-05-05 DIAGNOSIS — M5136 Other intervertebral disc degeneration, lumbar region: Secondary | ICD-10-CM | POA: Insufficient documentation

## 2020-05-05 MED ORDER — MELOXICAM 15 MG PO TABS: ORAL_TABLET | ORAL | 3 refills | Status: DC

## 2020-05-05 MED ORDER — DEXAMETHASONE 4 MG PO TABS: 4.0000 mg | ORAL_TABLET | Freq: Three times a day (TID) | ORAL | 0 refills | Status: DC

## 2020-05-05 NOTE — Assessment & Plan Note (Signed)
This is a very pleasant 54 year old female, she has a several month history of pain in her low back, she localizes it over the sacroiliac joints, she has seen a chiropractor and had some x-rays, manipulation, no improvement. On exam she has tenderness at the sacroiliac joints. We discussed the treatment protocol, we are going to start conservatively, Decadron followed by meloxicam, home rehab exercises, x-rays of her lumbar spine and SI joint fitness and return to see me in 2 to 4 weeks, we will consider bilateral SI joint injections if no better.

## 2020-05-05 NOTE — Progress Notes (Signed)
    Procedures performed today:    None.  Independent interpretation of notes and tests performed by another provider:   None.  Brief History, Exam, Impression, and Recommendations:    Low back pain This is a very pleasant 54 year old female, she has a several month history of pain in her low back, she localizes it over the sacroiliac joints, she has seen a chiropractor and had some x-rays, manipulation, no improvement. On exam she has tenderness at the sacroiliac joints. We discussed the treatment protocol, we are going to start conservatively, Decadron followed by meloxicam, home rehab exercises, x-rays of her lumbar spine and SI joint fitness and return to see me in 2 to 4 weeks, we will consider bilateral SI joint injections if no better.    ___________________________________________ Gwen Her. Dianah Field, M.D., ABFM., CAQSM. Primary Care and [place] Instructor of [employer] of Medicine

## 2020-05-08 ENCOUNTER — Telehealth: Payer: Self-pay

## 2020-05-08 NOTE — Telephone Encounter (Signed)
Patient reports taking the steroids over the weekend but woke up this morning sick and had a rash.  She took this as she is having a reaction to the steroids.  She would like to know how this changes the plan that was discussed during the [place].  Please advise.

## 2020-05-09 NOTE — Telephone Encounter (Signed)
Left message for a return call from patient.  

## 2020-05-09 NOTE — Telephone Encounter (Signed)
It does not, some flushing/rash as expected with steroids and is not a reaction but rather an expected response.  This typically goes away in a day, feeling any better today?

## 2020-05-22 ENCOUNTER — Ambulatory Visit: Payer: BC Managed Care – PPO | Admitting: Sports Medicine

## 2020-06-05 ENCOUNTER — Ambulatory Visit: Payer: BC Managed Care – PPO | Admitting: Sports Medicine

## 2020-06-21 DIAGNOSIS — Z1231 Encounter for screening mammogram for malignant neoplasm of breast: Secondary | ICD-10-CM | POA: Diagnosis not present

## 2020-07-27 DIAGNOSIS — Z6826 Body mass index (BMI) 26.0-26.9, adult: Secondary | ICD-10-CM | POA: Diagnosis not present

## 2020-07-27 DIAGNOSIS — Z13220 Encounter for screening for lipoid disorders: Secondary | ICD-10-CM | POA: Diagnosis not present

## 2020-07-27 DIAGNOSIS — Z0000 Encounter for general adult medical examination without abnormal findings: Secondary | ICD-10-CM | POA: Diagnosis not present

## 2020-07-27 DIAGNOSIS — Z01419 Encounter for gynecological examination (general) (routine) without abnormal findings: Secondary | ICD-10-CM | POA: Diagnosis not present

## 2020-07-27 DIAGNOSIS — Z1329 Encounter for screening for other suspected endocrine disorder: Secondary | ICD-10-CM | POA: Diagnosis not present

## 2020-07-27 DIAGNOSIS — Z131 Encounter for screening for diabetes mellitus: Secondary | ICD-10-CM | POA: Diagnosis not present

## 2020-11-29 DIAGNOSIS — L821 Other seborrheic keratosis: Secondary | ICD-10-CM | POA: Diagnosis not present

## 2020-11-29 DIAGNOSIS — D2372 Other benign neoplasm of skin of left lower limb, including hip: Secondary | ICD-10-CM | POA: Diagnosis not present

## 2020-11-29 DIAGNOSIS — L738 Other specified follicular disorders: Secondary | ICD-10-CM | POA: Diagnosis not present

## 2020-11-29 DIAGNOSIS — D1801 Hemangioma of skin and subcutaneous tissue: Secondary | ICD-10-CM | POA: Diagnosis not present

## 2020-12-28 IMAGING — DX DG SACRUM/COCCYX 2+V
3 series · 3 of 3 positions shown · non-contrast
Comparison: None.

CLINICAL DATA: Chronic bilateral sacroiliac joint pain, bilateral
hip pain

EXAM:
SACRUM AND COCCYX - 2+ VIEW; LUMBAR SPINE - COMPLETE 4+ VIEW

[coccyx ap]
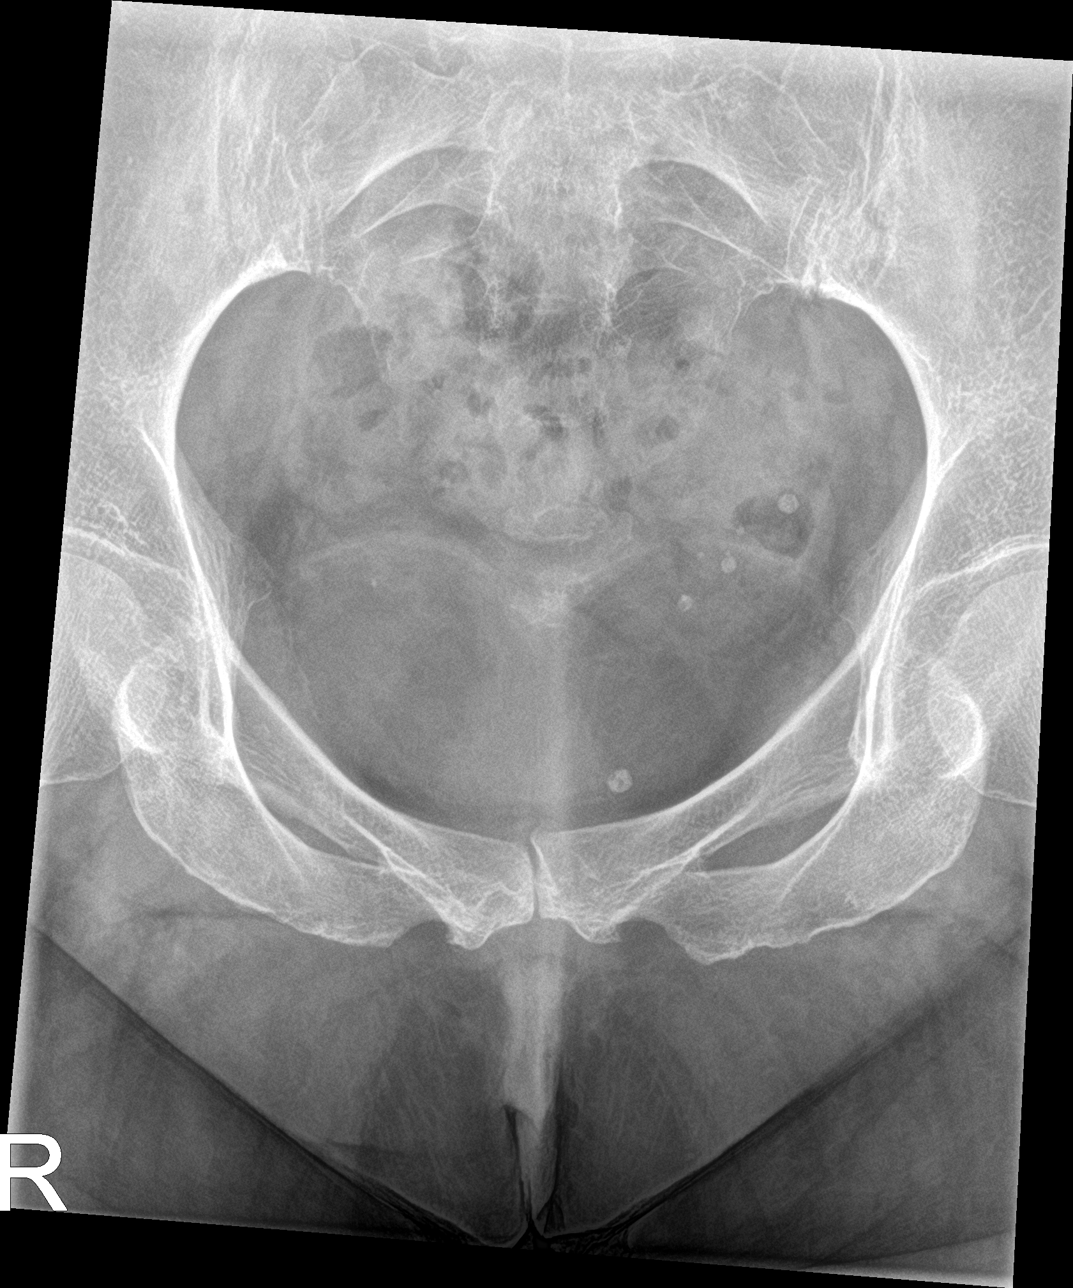

[sacrum ap]
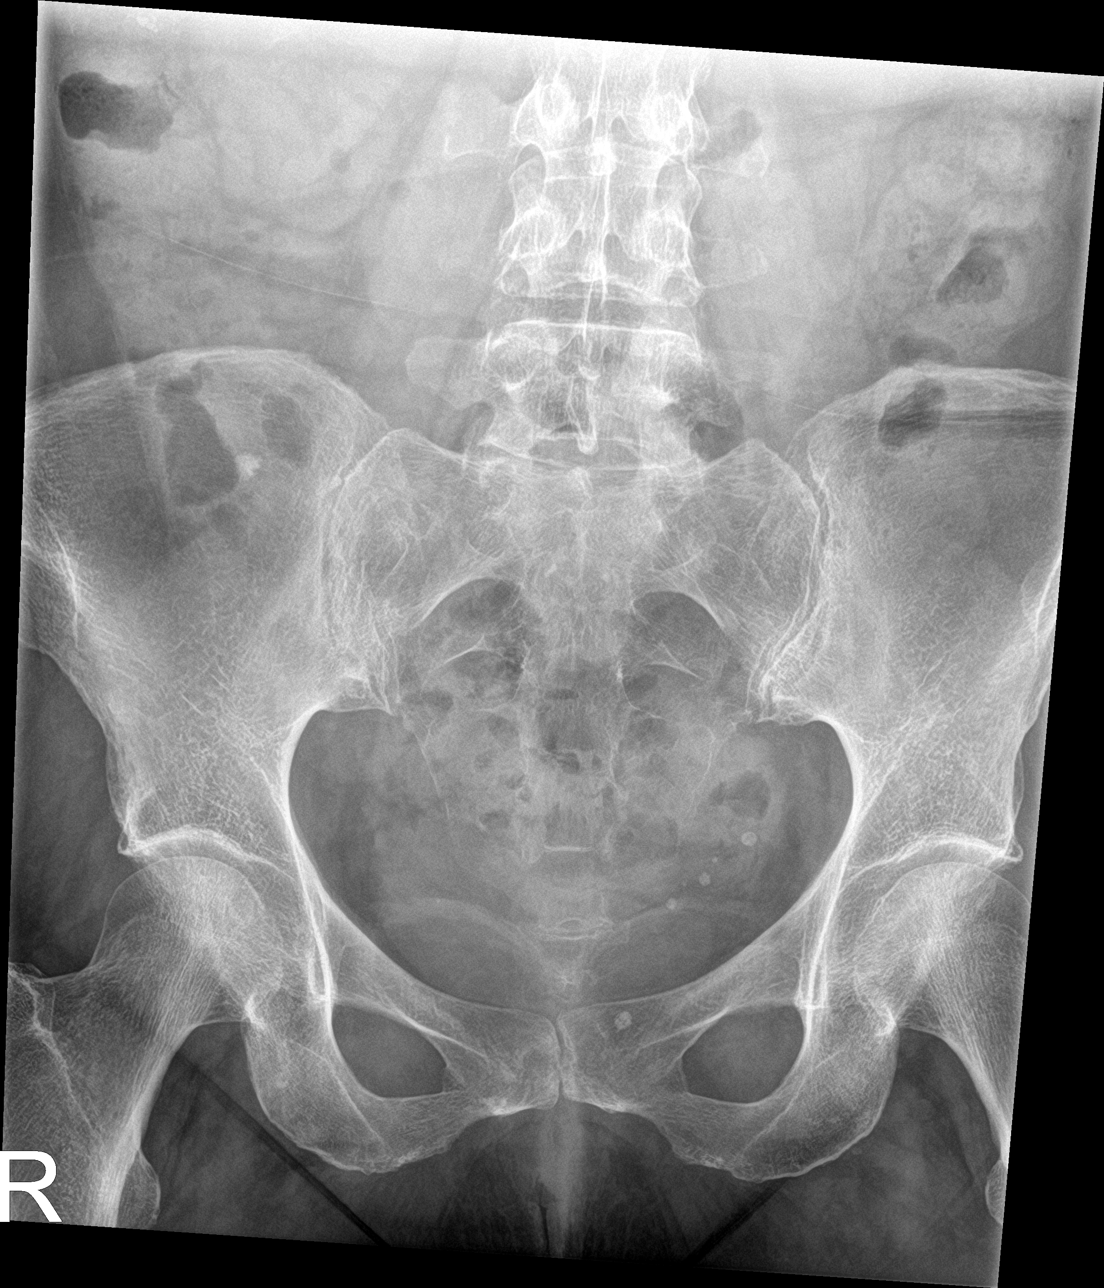

[sacrum lat]
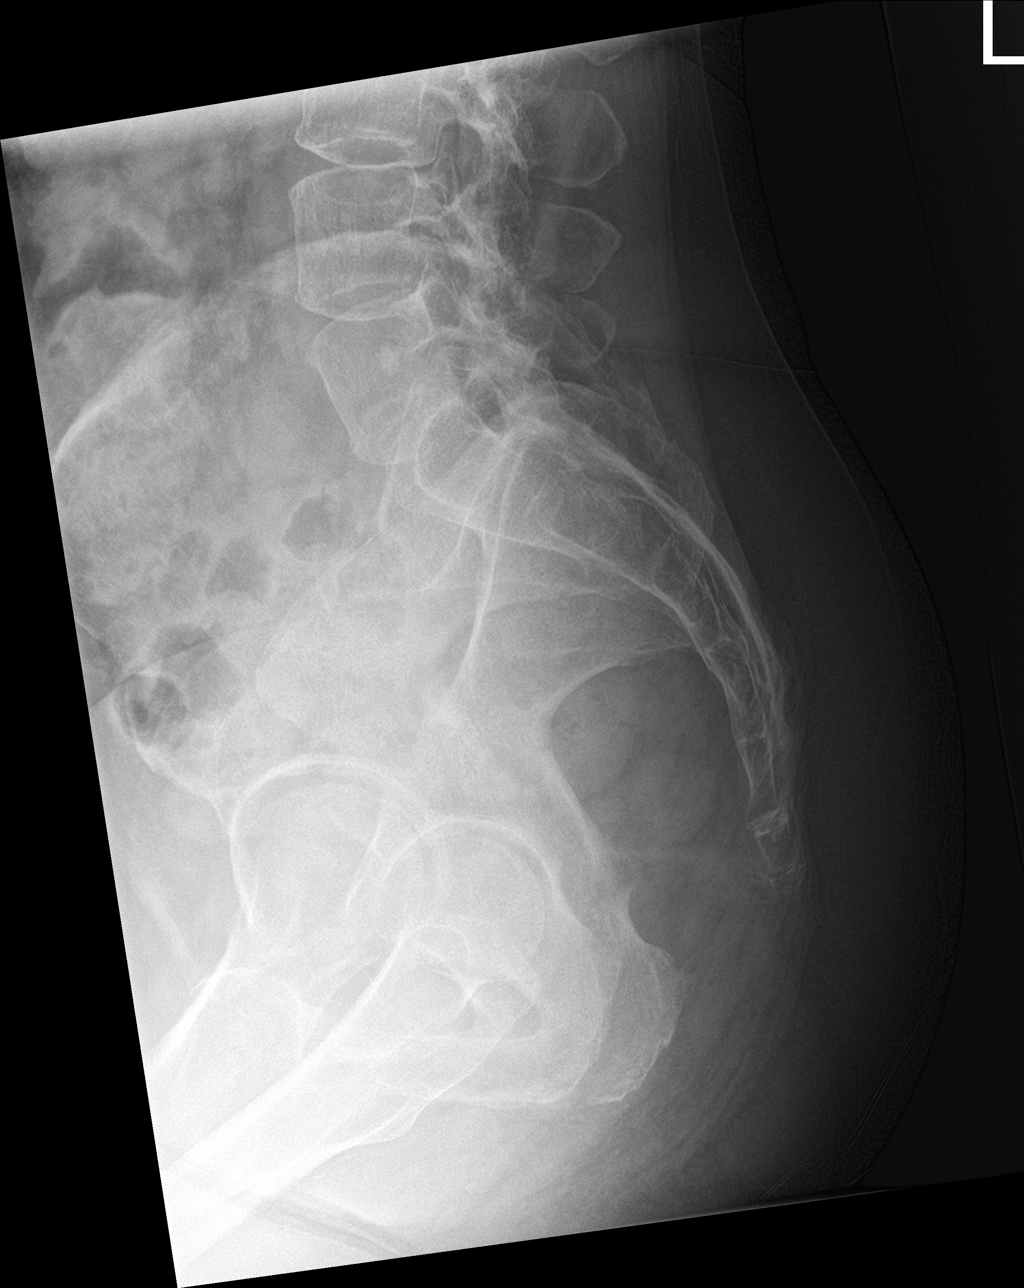

[3 of 3 positions shown; findings below may reference images not displayed]

FINDINGS: Lumbar spine: Frontal, bilateral oblique, lateral views of the
lumbar spine are obtained. There are 5 non-rib-bearing lumbar type
vertebral bodies with mild left convex scoliosis centered at L2/L3.
Otherwise alignment is anatomic. There are no acute fractures. Disc
spaces are well preserved. Mild facet hypertrophy at L4-5 and L5-S1.
The sacroiliac joints appear normal.

Sacrum/coccyx: Frontal and lateral views demonstrate no fractures.
Alignment is anatomic. The sacroiliac joints are normal. Visualized
portions of the bony pelvis are unremarkable.
IMPRESSION: 1. Mild facet hypertrophic changes at the lumbosacral junction.
2. Mild left convex scoliosis of the lumbar spine.
3. Otherwise unremarkable exam.

## 2021-03-01 DIAGNOSIS — Z9889 Other specified postprocedural states: Secondary | ICD-10-CM | POA: Diagnosis not present

## 2021-03-01 DIAGNOSIS — M5440 Lumbago with sciatica, unspecified side: Secondary | ICD-10-CM | POA: Diagnosis not present

## 2021-03-01 DIAGNOSIS — M542 Cervicalgia: Secondary | ICD-10-CM | POA: Diagnosis not present

## 2021-03-01 DIAGNOSIS — Z6825 Body mass index (BMI) 25.0-25.9, adult: Secondary | ICD-10-CM | POA: Diagnosis not present

## 2021-07-23 DIAGNOSIS — Z1231 Encounter for screening mammogram for malignant neoplasm of breast: Secondary | ICD-10-CM | POA: Diagnosis not present

## 2021-09-26 ENCOUNTER — Ambulatory Visit: Payer: BC Managed Care – PPO | Admitting: Sports Medicine

## 2021-09-26 ENCOUNTER — Ambulatory Visit (INDEPENDENT_AMBULATORY_CARE_PROVIDER_SITE_OTHER): Payer: BC Managed Care – PPO

## 2021-09-26 DIAGNOSIS — G8929 Other chronic pain: Secondary | ICD-10-CM | POA: Diagnosis not present

## 2021-09-26 DIAGNOSIS — M545 Low back pain, unspecified: Secondary | ICD-10-CM

## 2021-09-26 NOTE — Assessment & Plan Note (Signed)
This is a pleasant 56 year old female, chronic axial low back pain, last treated in 2021, we did some Decadron, she declined prednisone, she did not tolerate Decadron well and had significant tachycardia, palpitations, and a rash which explained was normal even on the low-dose. ?Pain is axial, mostly discogenic worse with Valsalva. ?No red flag symptoms, nothing radicular. ?Midline. ?She would like to try nonpharmacologic approach, we will get updated x-rays, she lives in [place] so we will do [facility] PT. ?Return to see me in 4 to 6 weeks, we will do MRI for interventional planning if not better. ?She may be amenable to try an NSAID at the follow-up if not better as well. ?

## 2021-09-26 NOTE — Progress Notes (Signed)
? ? ?  Procedures performed today:   ? ?None. ? ?Independent interpretation of notes and tests performed by another provider:  ? ?None. ? ?Brief History, Exam, Impression, and Recommendations:   ? ?Low back pain ?This is a pleasant 56 year old female, chronic axial low back pain, last treated in 2021, we did some Decadron, she declined prednisone, she did not tolerate Decadron well and had significant tachycardia, palpitations, and a rash which explained was normal even on the low-dose. ?Pain is axial, mostly discogenic worse with Valsalva. ?No red flag symptoms, nothing radicular. ?Midline. ?She would like to try nonpharmacologic approach, we will get updated x-rays, she lives in [place] so we will do [facility] PT. ?Return to see me in 4 to 6 weeks, we will do MRI for interventional planning if not better. ?She may be amenable to try an NSAID at the follow-up if not better as well. ? ? ? ?___________________________________________ ?Gwen Her. Dianah Field, M.D., ABFM., CAQSM. ?Primary Care and Sports Medicine ?[place] ? ?Adjunct Instructor of Family Medicine  ?[employer] ?

## 2021-11-05 ENCOUNTER — Encounter: Payer: Self-pay | Admitting: Nurse Practitioner

## 2021-11-14 ENCOUNTER — Ambulatory Visit: Payer: BC Managed Care – PPO | Admitting: Sports Medicine

## 2021-12-05 ENCOUNTER — Ambulatory Visit: Payer: BC Managed Care – PPO | Admitting: Sports Medicine

## 2021-12-12 ENCOUNTER — Other Ambulatory Visit (INDEPENDENT_AMBULATORY_CARE_PROVIDER_SITE_OTHER): Payer: BC Managed Care – PPO

## 2021-12-12 ENCOUNTER — Ambulatory Visit: Payer: BC Managed Care – PPO | Admitting: Nurse Practitioner

## 2021-12-12 ENCOUNTER — Encounter: Payer: Self-pay | Admitting: Nurse Practitioner

## 2021-12-12 VITALS — BP 120/76 | HR 72 | Resp 16 | Ht 68.0 in | Wt 169.0 lb

## 2021-12-12 DIAGNOSIS — R194 Change in bowel habit: Secondary | ICD-10-CM | POA: Diagnosis not present

## 2021-12-12 DIAGNOSIS — R1032 Left lower quadrant pain: Secondary | ICD-10-CM | POA: Diagnosis not present

## 2021-12-12 DIAGNOSIS — R1031 Right lower quadrant pain: Secondary | ICD-10-CM | POA: Diagnosis not present

## 2021-12-12 DIAGNOSIS — R140 Abdominal distension (gaseous): Secondary | ICD-10-CM

## 2021-12-12 LAB — TSH: TSH: 1.69 u[IU]/mL (ref 0.35–5.50)

## 2021-12-12 NOTE — Progress Notes (Signed)
Chief Complaint:  bowel changes   Assessment & Plan   #56 year old female with bowel changes consisting of increased frequency of solid , thin bowel movements.  Bowel changes associated with crampy lower abdominal discomfort relieved with defecation.  Also with recent increase in flatus /  bloating.  Etiology of symptoms unclear but does not seem to coincide with any medications nor dietary changes  She could be developing IBS. Also consider SIBO .  Obtain tTg,IgA, TSH Will contact her when above results available.  If negative and symptoms persists then will possibly proceed with colonoscopy to evaluate bowel changes.  She would have otherwise been due for surveillance colonoscopy October 2024   HPI:     Patient is a 56 year old female known to Dr. Henrene Pastor, last seen October 2019.  She has a history of adenomatous colon polyps, diverticulosis, H. pylori infection, chronic low back pain, hyperlipidemia she has a family history of colon cancer in a parent diagnosed at age 71.   Nafeesa developed bowel changes about 4-5 months ago. Bowel movements have increased in frequency. She was having 1 BM daily but now having 5-6 BMs a daily. Stools are solid , thin in caliber and she is worried about " a blockage" .Occasionally has nocturnal bowel movements. Food doesn't tend to trigger bowel movements.  She has associated mild generalized lower abdominal cramping relieved with defecation. She has been having more flatus than usual and feels bloated. No blood in stool. She doesn't take any medications. Takes Vitamin D but no other OTC supplements. No dietary changes. Her weight is stable.   Previous GI Evaluation   October 2019 EGD for epigastric pain - Normal esophagus. - Mild gastritis. Biopsied. - Normal examined duodenum. No active ulceration..  October 2019 colonoscopy -history of colon polyps and family history colon cancer in a parent greater than 65 years of - Diverticulosis in the sigmoid  colon. - Internal hemorrhoids. - The examination was otherwise normal on direct and retroflexion views. - No specimens collected.   Past Medical History:  Diagnosis Date   Colitis    Diverticulosis    Fibroids    Hyperlipidemia    Pelvic pain in female    PONV (postoperative nausea and vomiting)    Past Surgical History:  Procedure Laterality Date   COLONOSCOPY     DILATION AND CURETTAGE OF UTERUS     x 2   resection of meningioma     2006   svd     x 1   VAGINAL HYSTERECTOMY     still has ovaries   Family History  Problem Relation Age of Onset   Colon cancer Mother    Other Father        Creutzfeldt-Jakob disease   Hyperlipidemia Brother    Social History   Tobacco Use   Smoking status: Never   Smokeless tobacco: Never  Vaping Use   Vaping Use: Never used  Substance Use Topics   Alcohol use: Yes    Alcohol/week: 2.0 standard drinks of alcohol    Types: 2 Standard drinks or equivalent per week    Comment: occasionally - beer 10/month   Drug use: No   No current outpatient medications on file.   No current facility-administered medications for this visit.   Allergies  Allergen Reactions   Dilaudid [Hydromorphone Hcl] Itching and Rash   Hydrocodone Hives   Penicillins Hives   Sulfa Drugs Cross Reactors Hives   Tylenol [Acetaminophen]     Liquid  Tylenol-SOB,hives, liver pain     Review of Systems: Positive for fatigue, itching.  All other systems reviewed and negative except where noted in HPI.   Wt Readings from Last 3 Encounters:  05/07/19 176 lb (79.8 kg)  08/05/18 162 lb (73.5 kg)  07/07/18 163 lb (73.9 kg)    Physical Exam   LMP 02/19/2011  Constitutional:  Generally well appearing female in no acute distress. Psychiatric: Pleasant. Normal mood and affect. Behavior is normal. EENT: Pupils normal.  Conjunctivae are normal. No scleral icterus. Neck supple.  Cardiovascular: Normal rate, regular rhythm. No edema Pulmonary/chest: Effort  normal and breath sounds normal. No wheezing, rales or rhonchi. Abdominal: Soft, nondistended, nontender. Bowel sounds active throughout. There are no masses palpable. No hepatomegaly. Neurological: Alert and oriented to person place and time. Skin: Skin is warm and dry. No rashes noted.  Tye Savoy, NP  12/12/2021, 8:23 AM

## 2021-12-12 NOTE — Patient Instructions (Signed)
Your provider has requested that you go to the basement level for lab work before leaving today. Press "B" on the elevator. The lab is located at the first door on the left as you exit the elevator.  If you are age 58 or older, your body mass index should be between 23-30. Your Body mass index is 25.7 kg/m. If this is out of the aforementioned range listed, please consider follow up with your Primary Care Provider.  If you are age 7 or younger, your body mass index should be between 19-25. Your Body mass index is 25.7 kg/m. If this is out of the aformentioned range listed, please consider follow up with your Primary Care Provider.   ________________________________________________________  The [facility] providers would like to encourage you to use [facility] to communicate with providers for non-urgent requests or questions.  Due to long hold times on the telephone, sending your provider a message by [facility] may be a faster and more efficient way to get a response.  Please allow 48 business hours for a response.  Please remember that this is for non-urgent requests.  _______________________________________________________

## 2021-12-12 NOTE — Progress Notes (Signed)
Assessment and plans noted ?

## 2021-12-13 LAB — IGA: Immunoglobulin A: 88 mg/dL (ref 47–310)

## 2021-12-13 LAB — TISSUE TRANSGLUTAMINASE ABS,IGG,IGA
(tTG) Ab, IgA: <1 U/mL
(tTG) Ab, IgG: <1 U/mL

## 2021-12-20 ENCOUNTER — Other Ambulatory Visit: Payer: Self-pay

## 2021-12-20 DIAGNOSIS — R194 Change in bowel habit: Secondary | ICD-10-CM

## 2021-12-20 MED ORDER — NA SULFATE-K SULFATE-MG SULF 17.5-3.13-1.6 GM/177ML PO SOLN: ORAL | 0 refills | Status: DC

## 2022-01-02 ENCOUNTER — Encounter: Payer: Self-pay | Admitting: Internal Medicine

## 2022-01-09 ENCOUNTER — Encounter: Payer: BC Managed Care – PPO | Admitting: Internal Medicine

## 2022-01-23 DIAGNOSIS — L821 Other seborrheic keratosis: Secondary | ICD-10-CM | POA: Diagnosis not present

## 2022-01-23 DIAGNOSIS — Z129 Encounter for screening for malignant neoplasm, site unspecified: Secondary | ICD-10-CM | POA: Diagnosis not present

## 2022-01-23 DIAGNOSIS — D369 Benign neoplasm, unspecified site: Secondary | ICD-10-CM | POA: Diagnosis not present

## 2022-01-29 ENCOUNTER — Telehealth: Payer: Self-pay | Admitting: Internal Medicine

## 2022-01-29 NOTE — Telephone Encounter (Signed)
Received a call from patient this morning cancelling her procedure tomorrow as she broke her toe over the weekend and is having a difficult time getting around.  She has rescheduled her procedure for 10/2 at 8 a.m.  Thank you.

## 2022-01-30 ENCOUNTER — Encounter: Payer: BC Managed Care – PPO | Admitting: Internal Medicine

## 2022-02-05 ENCOUNTER — Encounter: Payer: BC Managed Care – PPO | Admitting: Internal Medicine

## 2022-02-06 ENCOUNTER — Encounter: Payer: BC Managed Care – PPO | Admitting: Internal Medicine

## 2022-02-25 ENCOUNTER — Ambulatory Visit (AMBULATORY_SURGERY_CENTER): Payer: BC Managed Care – PPO | Admitting: Internal Medicine

## 2022-02-25 ENCOUNTER — Encounter: Payer: Self-pay | Admitting: Internal Medicine

## 2022-02-25 VITALS — BP 129/72 | HR 71 | Temp 96.6°F | Resp 13 | Ht 68.0 in | Wt 169.0 lb

## 2022-02-25 DIAGNOSIS — K5730 Diverticulosis of large intestine without perforation or abscess without bleeding: Secondary | ICD-10-CM | POA: Diagnosis not present

## 2022-02-25 DIAGNOSIS — R1031 Right lower quadrant pain: Secondary | ICD-10-CM

## 2022-02-25 DIAGNOSIS — R194 Change in bowel habit: Secondary | ICD-10-CM

## 2022-02-25 DIAGNOSIS — K648 Other hemorrhoids: Secondary | ICD-10-CM | POA: Diagnosis not present

## 2022-02-25 DIAGNOSIS — R140 Abdominal distension (gaseous): Secondary | ICD-10-CM

## 2022-02-25 DIAGNOSIS — D122 Benign neoplasm of ascending colon: Secondary | ICD-10-CM | POA: Diagnosis not present

## 2022-02-25 MED ORDER — SODIUM CHLORIDE 0.9 % IV SOLN: 500.0000 mL | INTRAVENOUS | Status: DC

## 2022-02-25 NOTE — Patient Instructions (Addendum)
Information on polyps and diverticulosis given to you today.  Await pathology results.  Resume previous diet and medications.   YOU HAD AN ENDOSCOPIC PROCEDURE TODAY AT THE [facility]:   Refer to the procedure report that was given to you for any specific questions about what was found during the examination.  If the procedure report does not answer your questions, please call your gastroenterologist to clarify.  If you requested that your care partner not be given the details of your procedure findings, then the procedure report has been included in a sealed envelope for you to review at your convenience later.  YOU SHOULD EXPECT: Some feelings of bloating in the abdomen. Passage of more gas than usual.  Walking can help get rid of the air that was put into your GI tract during the procedure and reduce the bloating. If you had a lower endoscopy (such as a colonoscopy or flexible sigmoidoscopy) you may notice spotting of blood in your stool or on the toilet paper. If you underwent a bowel prep for your procedure, you may not have a normal bowel movement for a few days.  Please Note:  You might notice some irritation and congestion in your nose or some drainage.  This is from the oxygen used during your procedure.  There is no need for concern and it should clear up in a day or so.  SYMPTOMS TO REPORT IMMEDIATELY:  Following lower endoscopy (colonoscopy or flexible sigmoidoscopy):  Excessive amounts of blood in the stool  Significant tenderness or worsening of abdominal pains  Swelling of the abdomen that is new, acute  Fever of 100F or higher  For urgent or emergent issues, a gastroenterologist can be reached at any hour by calling [phone]. Do not use MyChart messaging for urgent concerns.    DIET:  We do recommend a small meal at first, but then you may proceed to your regular diet.  Drink plenty of fluids but you should avoid alcoholic beverages for 24  hours.  ACTIVITY:  You should plan to take it easy for the rest of today and you should NOT DRIVE or use heavy machinery until tomorrow (because of the sedation medicines used during the test).    FOLLOW UP: Our staff will call the number listed on your records the next business day following your procedure.  We will call around 7:15- 8:00 am to check on you and address any questions or concerns that you may have regarding the information given to you following your procedure. If we do not reach you, we will leave a message.     If any biopsies were taken you will be contacted by phone or by letter within the next 1-3 weeks.  Please call us at [phone] if you have not heard about the biopsies in 3 weeks.    SIGNATURES/CONFIDENTIALITY: You and/or your care partner have signed paperwork which will be entered into your electronic medical record.  These signatures attest to the fact that that the information above on your After Visit Summary has been reviewed and is understood.  Full responsibility of the confidentiality of this discharge information lies with you and/or your care-partner. 

## 2022-02-25 NOTE — Op Note (Signed)
[facility] Patient Name: Crystal Brooks Procedure Date: 02/25/2022 8:03 AM MRN: [id number] Endoscopist: Docia Chuck. Henrene Pastor , MD Age: 56 Referring MD:  Date of Birth: 1966/02/13 Gender: Female Account #: [id number] Procedure:                Colonoscopy with cold snare polypectomy x 1 Indications:              Abdominal pain, Change in bowel habits. Family                            history of colon cancer in mother. Personal history                            of nonadvanced adenomatous colon polyp. Previous                            examinations 2003, 2006, 2014, 2019 Medicines:                Monitored Anesthesia Care Procedure:                Pre-Anesthesia Assessment:                           - Prior to the procedure, a History and Physical                            was performed, and patient medications and                            allergies were reviewed. The patient's tolerance of                            previous anesthesia was also reviewed. The risks                            and benefits of the procedure and the sedation                            options and risks were discussed with the patient.                            All questions were answered, and informed consent                            was obtained. Prior Anticoagulants: The patient has                            taken no previous anticoagulant or antiplatelet                            agents. ASA Grade Assessment: I - A normal, healthy                            patient. After reviewing the risks and benefits,  the patient was deemed in satisfactory condition to                            undergo the procedure.                           After obtaining informed consent, the colonoscope                            was passed under direct vision. Throughout the                            procedure, the patient's blood pressure, pulse, and                            oxygen  saturations were monitored continuously. The                            CF [id number] #[id number] was introduced through the anus                            and advanced to the the cecum, identified by                            appendiceal orifice and ileocecal valve. The                            ileocecal valve, appendiceal orifice, and rectum                            were photographed. The quality of the bowel                            preparation was good. The colonoscopy was performed                            without difficulty. The patient tolerated the                            procedure well. The bowel preparation used was                            SUPREP via split dose instruction. Scope In: 8:18:59 AM Scope Out: 8:37:08 AM Scope Withdrawal Time: 0 hours 11 minutes 45 seconds  Total Procedure Duration: 0 hours 18 minutes 9 seconds  Findings:                 A 3 mm polyp was found in the ascending colon. The                            polyp was removed with a cold snare. Resection and                            retrieval were complete.  Multiple diverticula were found in the sigmoid                            colon and cecum.                           Internal hemorrhoids were found during                            retroflexion. There was mild melanosis.                           The exam was otherwise without abnormality on                            direct and retroflexion views. Complications:            No immediate complications. Estimated blood loss:                            None. Estimated Blood Loss:     Estimated blood loss: none. Impression:               - One 3 mm polyp in the ascending colon, removed                            with a cold snare. Resected and retrieved.                           - Diverticulosis in the sigmoid colon and in the                            cecum.                           - Internal hemorrhoids. Mild  melanosis coli                           - The examination was otherwise normal on direct                            and retroflexion views. Recommendation:           - Repeat colonoscopy in 5 years for surveillance.                           - Patient has a contact number available for                            emergencies. The signs and symptoms of potential                            delayed complications were discussed with the                            patient. Return to normal activities tomorrow.  Written discharge instructions were provided to the                            patient.                           - Resume previous diet.                           - Continue present medications.                           - Await pathology results. Docia Chuck. Henrene Pastor, MD 02/25/2022 8:45:51 AM This report has been signed electronically.

## 2022-02-25 NOTE — Progress Notes (Signed)
Chief Complaint:  bowel changes     Assessment & Plan    #56 year old female with bowel changes consisting of increased frequency of solid , thin bowel movements.  Bowel changes associated with crampy lower abdominal discomfort relieved with defecation.  Also with recent increase in flatus /  bloating.  Etiology of symptoms unclear but does not seem to coincide with any medications nor dietary changes  She could be developing IBS. Also consider SIBO .  Obtain tTg,IgA, TSH Will contact her when above results available.  If negative and symptoms persists then will possibly proceed with colonoscopy to evaluate bowel changes.  She would have otherwise been due for surveillance colonoscopy October 2024    HPI:       Patient is a 56 year old female known to Dr. Henrene Pastor, last seen October 2019.  She has a history of adenomatous colon polyps, diverticulosis, H. pylori infection, chronic low back pain, hyperlipidemia she has a family history of colon cancer in a parent diagnosed at age 56.   Crystal Brooks developed bowel changes about 4-5 months ago. Bowel movements have increased in frequency. She was having 1 BM daily but now having 5-6 BMs a daily. Stools are solid , thin in caliber and she is worried about " a blockage" .Occasionally has nocturnal bowel movements. Food doesn't tend to trigger bowel movements.  She has associated mild generalized lower abdominal cramping relieved with defecation. She has been having more flatus than usual and feels bloated. No blood in stool. She doesn't take any medications. Takes Vitamin D but no other OTC supplements. No dietary changes. Her weight is stable.    Previous GI Evaluation    October 2019 EGD for epigastric pain - Normal esophagus. - Mild gastritis. Biopsied. - Normal examined duodenum. No active ulceration..   October 2019 colonoscopy -history of colon polyps and family history colon cancer in a parent greater than 65 years of - Diverticulosis in the  sigmoid colon. - Internal hemorrhoids. - The examination was otherwise normal on direct and retroflexion views. - No specimens collected.         Past Medical History:  Diagnosis Date   Colitis     Diverticulosis     Fibroids     Hyperlipidemia     Pelvic pain in female     PONV (postoperative nausea and vomiting)           Past Surgical History:  Procedure Laterality Date   COLONOSCOPY       DILATION AND CURETTAGE OF UTERUS        x 2   resection of meningioma        2006   svd        x 1   VAGINAL HYSTERECTOMY        still has ovaries         Family History  Problem Relation Age of Onset   Colon cancer Mother     Other Father          Creutzfeldt-Jakob disease   Hyperlipidemia Brother      Social History         Tobacco Use   Smoking status: Never   Smokeless tobacco: Never  Vaping Use   Vaping Use: Never used  Substance Use Topics   Alcohol use: Yes      Alcohol/week: 2.0 standard drinks of alcohol      Types: 2 Standard drinks or equivalent per week      Comment: occasionally -  beer 10/month   Drug use: No    No current outpatient medications on file.    No current facility-administered medications for this visit.         Allergies  Allergen Reactions   Dilaudid [Hydromorphone Hcl] Itching and Rash   Hydrocodone Hives   Penicillins Hives   Sulfa Drugs Cross Reactors Hives   Tylenol [Acetaminophen]        Liquid Tylenol-SOB,hives, liver pain        Review of Systems: Positive for fatigue, itching.  All other systems reviewed and negative except where noted in HPI.       Wt Readings from Last 3 Encounters:  05/07/19 176 lb (79.8 kg)  08/05/18 162 lb (73.5 kg)  07/07/18 163 lb (73.9 kg)      Physical Exam    LMP 02/19/2011  Constitutional:  Generally well appearing female in no acute distress. Psychiatric: Pleasant. Normal mood and affect. Behavior is normal. EENT: Pupils normal.  Conjunctivae are normal. No scleral icterus. Neck  supple.  Cardiovascular: Normal rate, regular rhythm. No edema Pulmonary/chest: Effort normal and breath sounds normal. No wheezing, rales or rhonchi. Abdominal: Soft, nondistended, nontender. Bowel sounds active throughout. There are no masses palpable. No hepatomegaly. Neurological: Alert and oriented to person place and time. Skin: Skin is warm and dry. No rashes noted.   Tye Savoy, NP  12/12/2021, 8:23 AM

## 2022-02-25 NOTE — Progress Notes (Signed)
Report to PACU, RN, vss, BBS= Clear.  

## 2022-02-25 NOTE — Progress Notes (Signed)
Called to room to assist during endoscopic procedure.  Patient ID and intended procedure confirmed with present staff. Received instructions for my participation in the procedure from the performing physician.  

## 2022-02-26 ENCOUNTER — Telehealth: Payer: Self-pay

## 2022-02-26 NOTE — Telephone Encounter (Signed)
  Follow up Call-     02/25/2022    7:22 AM  Call back number  Post procedure Call Back phone  # [phone]  Permission to leave phone message Yes    Post op call attempted, no answer, left WM.

## 2022-02-28 ENCOUNTER — Encounter: Payer: Self-pay | Admitting: Internal Medicine

## 2022-03-27 DIAGNOSIS — Z79899 Other long term (current) drug therapy: Secondary | ICD-10-CM | POA: Diagnosis not present

## 2022-03-27 DIAGNOSIS — Z1152 Encounter for screening for COVID-19: Secondary | ICD-10-CM | POA: Diagnosis not present

## 2022-03-27 DIAGNOSIS — Z880 Allergy status to penicillin: Secondary | ICD-10-CM | POA: Diagnosis not present

## 2022-03-27 DIAGNOSIS — R1013 Epigastric pain: Secondary | ICD-10-CM | POA: Diagnosis not present

## 2022-03-27 DIAGNOSIS — R638 Other symptoms and signs concerning food and fluid intake: Secondary | ICD-10-CM | POA: Diagnosis not present

## 2022-03-27 DIAGNOSIS — R0789 Other chest pain: Secondary | ICD-10-CM | POA: Diagnosis not present

## 2022-03-27 DIAGNOSIS — Z885 Allergy status to narcotic agent status: Secondary | ICD-10-CM | POA: Diagnosis not present

## 2022-03-27 DIAGNOSIS — R079 Chest pain, unspecified: Secondary | ICD-10-CM | POA: Diagnosis not present

## 2022-03-27 DIAGNOSIS — R7989 Other specified abnormal findings of blood chemistry: Secondary | ICD-10-CM | POA: Diagnosis not present

## 2022-03-27 DIAGNOSIS — Z882 Allergy status to sulfonamides status: Secondary | ICD-10-CM | POA: Diagnosis not present

## 2022-05-13 ENCOUNTER — Telehealth: Payer: Self-pay | Admitting: Internal Medicine

## 2022-05-13 DIAGNOSIS — Z90710 Acquired absence of both cervix and uterus: Secondary | ICD-10-CM | POA: Diagnosis not present

## 2022-05-13 DIAGNOSIS — Z885 Allergy status to narcotic agent status: Secondary | ICD-10-CM | POA: Diagnosis not present

## 2022-05-13 DIAGNOSIS — R030 Elevated blood-pressure reading, without diagnosis of hypertension: Secondary | ICD-10-CM | POA: Diagnosis not present

## 2022-05-13 DIAGNOSIS — K529 Noninfective gastroenteritis and colitis, unspecified: Secondary | ICD-10-CM | POA: Diagnosis not present

## 2022-05-13 DIAGNOSIS — R112 Nausea with vomiting, unspecified: Secondary | ICD-10-CM | POA: Diagnosis not present

## 2022-05-13 DIAGNOSIS — Z880 Allergy status to penicillin: Secondary | ICD-10-CM | POA: Diagnosis not present

## 2022-05-13 DIAGNOSIS — K760 Fatty (change of) liver, not elsewhere classified: Secondary | ICD-10-CM | POA: Diagnosis not present

## 2022-05-13 DIAGNOSIS — R197 Diarrhea, unspecified: Secondary | ICD-10-CM | POA: Diagnosis not present

## 2022-05-13 DIAGNOSIS — K5730 Diverticulosis of large intestine without perforation or abscess without bleeding: Secondary | ICD-10-CM | POA: Diagnosis not present

## 2022-05-13 DIAGNOSIS — K6389 Other specified diseases of intestine: Secondary | ICD-10-CM | POA: Diagnosis not present

## 2022-05-13 DIAGNOSIS — Z882 Allergy status to sulfonamides status: Secondary | ICD-10-CM | POA: Diagnosis not present

## 2022-05-13 DIAGNOSIS — K625 Hemorrhage of anus and rectum: Secondary | ICD-10-CM | POA: Diagnosis not present

## 2022-05-13 NOTE — Telephone Encounter (Signed)
Received a call to the [facility] on call pager. Patient states that she developed lower ab pain earlier this evening as well as numbness. She called EMS but after she had N&V, she felt better and declined to be transported to the hospital. Then afterwards she developed bloody diarrhea with several blood clots. She has continued to pass blood clots every 5 minutes. I recommended that she proceed to the ED to get further work up and treatment. Will CC Dr. Henrene Pastor to this telephone note

## 2022-05-15 ENCOUNTER — Other Ambulatory Visit: Payer: Self-pay

## 2022-05-15 ENCOUNTER — Telehealth: Payer: Self-pay | Admitting: Nurse Practitioner

## 2022-05-15 DIAGNOSIS — K625 Hemorrhage of anus and rectum: Secondary | ICD-10-CM

## 2022-05-15 DIAGNOSIS — R197 Diarrhea, unspecified: Secondary | ICD-10-CM

## 2022-05-15 MED ORDER — METRONIDAZOLE 500 MG PO TABS: 500.0000 mg | ORAL_TABLET | Freq: Two times a day (BID) | ORAL | 0 refills | Status: DC

## 2022-05-15 MED ORDER — CIPROFLOXACIN HCL 500 MG PO TABS: 500.0000 mg | ORAL_TABLET | Freq: Two times a day (BID) | ORAL | 0 refills | Status: DC

## 2022-05-15 NOTE — Telephone Encounter (Signed)
Pt reports she went to the ER at [facility] on 29-Aug-2022 as recommended by Dr. Lorenso Courier. Pt states she had a CT scan and was diagnosed with colitis and they prescribed her tramadol which she only took once because it broke her out in a rash. Pt is still having abdominal pain and nausea but has not vomited since 08-29-2022. Pt feels like abdominal pain and nausea seem to be worse today. She is also still having rectal bleeding although bleeding seems to be slowing down. When the bleeding first started on 29-Aug-2022 she reports she was passing clots but pt is no longer passing clots. Pt reports she has some lightheadedness but she attributes this to not eating very much over the past 2 days due to the abd pain and nausea. Pt denies fainting and SOB. Pt has not had diarrhea since 08/29/22 but reports she has passed blood without stool. Pt scheduled for office visit on 05/22/22 at 11 am with Tye Savoy NP. Pt wanted to know if there were any recommendations or what she should watch out for.  Dr. Rush Landmark as DOD please advise.

## 2022-05-15 NOTE — Telephone Encounter (Signed)
Inbound call from patient stating that she was advised to go to the ER on 12/18 by the Dr. Lorenso Courier when she was on call. Patient stated that she was told that she has colitis and was told to follow up with Dr. Henrene Pastor. Patient stated she is not sure what she needs to do because she is still feeling bad and passing blood. Patient is requesting a call back to discuss. Please advise.

## 2022-05-15 NOTE — Telephone Encounter (Signed)
I have reviewed the patient's chart. I reviewed her recent colonoscopy this year. She has history of diverticulosis in that particular area and did not have any abnormalities noted at that time. The CT scan report (I cannot see images) suggest circumferential wall thickening in the sigmoid colon. I would presume that this is diverticulitis. She should be on antibiotics. Ciprofloxacin 500 mg twice daily + Flagyl 500 mg twice daily for 10-day course. She should come in for labs CBC/BMP/ESR/CRP (STAT labs). If she can give a stool study then send for GI Pathogen Panel and C. Difficile. If she worsens/progresses, she will need to return to the hospital for further evaluation. Thanks. GM

## 2022-05-15 NOTE — Telephone Encounter (Signed)
Spoke with pt and gave pt recommendations. Orders placed. Pt stated she would not be able to get lab work done today because doesn't have anyone to drive her but stated she would come to lab early tomorrow morning.

## 2022-05-16 ENCOUNTER — Other Ambulatory Visit (INDEPENDENT_AMBULATORY_CARE_PROVIDER_SITE_OTHER): Payer: BC Managed Care – PPO

## 2022-05-16 DIAGNOSIS — K625 Hemorrhage of anus and rectum: Secondary | ICD-10-CM

## 2022-05-16 DIAGNOSIS — R197 Diarrhea, unspecified: Secondary | ICD-10-CM | POA: Diagnosis not present

## 2022-05-16 LAB — CBC WITH DIFFERENTIAL/PLATELET
Basophils Absolute: 0.1 10*3/uL (ref 0.0–0.1)
Basophils Relative: 1.3 % (ref 0.0–3.0)
Eosinophils Absolute: 0.3 10*3/uL (ref 0.0–0.7)
Eosinophils Relative: 4.5 % (ref 0.0–5.0)
HCT: 43.7 % (ref 36.0–46.0)
Hemoglobin: 14.9 g/dL (ref 12.0–15.0)
Lymphocytes Relative: 18.5 % (ref 12.0–46.0)
Lymphs Abs: 1.2 10*3/uL (ref 0.7–4.0)
MCHC: 34 g/dL (ref 30.0–36.0)
MCV: 90.1 fl (ref 78.0–100.0)
Monocytes Absolute: 0.5 10*3/uL (ref 0.1–1.0)
Monocytes Relative: 7.5 % (ref 3.0–12.0)
Neutro Abs: 4.4 10*3/uL (ref 1.4–7.7)
Neutrophils Relative %: 68.2 % (ref 43.0–77.0)
Platelets: 215 10*3/uL (ref 150.0–400.0)
RBC: 4.85 Mil/uL (ref 3.87–5.11)
RDW: 12.6 % (ref 11.5–15.5)
WBC: 6.4 10*3/uL (ref 4.0–10.5)

## 2022-05-16 LAB — BASIC METABOLIC PANEL
BUN: 10 mg/dL (ref 6–23)
CO2: 31 mEq/L (ref 19–32)
Calcium: 9.5 mg/dL (ref 8.4–10.5)
Chloride: 103 mEq/L (ref 96–112)
Creatinine, Ser: 0.52 mg/dL (ref 0.40–1.20)
GFR: 102.23 mL/min (ref >60.00)
Glucose, Bld: 88 mg/dL (ref 70–99)
Potassium: 3.6 mEq/L (ref 3.5–5.1)
Sodium: 141 mEq/L (ref 135–145)

## 2022-05-16 LAB — C-REACTIVE PROTEIN: CRP: <1 mg/dL (ref 0.5–20.0)

## 2022-05-16 LAB — SEDIMENTATION RATE: Sed Rate: 13 mm/hr (ref 0–30)

## 2022-05-17 MED ORDER — OXYCODONE HCL 5 MG PO TABS: 5.0000 mg | ORAL_TABLET | ORAL | 0 refills | Status: AC | PRN

## 2022-05-17 NOTE — Telephone Encounter (Signed)
Spoke with pt and gave pt recommendations. Pt verbalize understanding. Pt also requested that recommendations be sent to her mychart. Mychart message sent to pt.

## 2022-05-17 NOTE — Telephone Encounter (Signed)
I agree this is most likely diverticulitis.  Since she has a listed allergic reaction to ciprofloxacin, then just take the metronidazole as prescribed.  She has listed allergic reactions to hydrocodone and hydromorphone.  I will prescribe her oxycodone to try and give her relief from the abdominal pain and hopefully keep her out of the emergency department over the weekend. I recommend she take 25 mg of Benadryl prior to a dose of the oxycodone to decrease the chance of allergic reaction.  Understand that the Benadryl will most likely make her tired, she should not be driving for the next few days while taking these medicines.  Call on-call physician over the weekend if needed.  - H. Danis

## 2022-05-17 NOTE — Telephone Encounter (Signed)
Pt called in to report that she remembered that she previously took Cipro years ago and became extremely nauseous and broke out in a rash. Pt has not started antibiotics yet. Pt reports she still feels terrible and has severe abdominal cramping. Pt also was concerned because she hasn't had a bowel movement since Sunday night.  Dr. Loletha Carrow as DOD please advise.

## 2022-05-17 NOTE — Addendum Note (Signed)
Addended by: Nelida Meuse on: 05/17/2022 11:18 AM   Modules accepted: Orders

## 2022-05-22 ENCOUNTER — Telehealth: Payer: Self-pay | Admitting: Internal Medicine

## 2022-05-22 ENCOUNTER — Ambulatory Visit: Payer: BC Managed Care – PPO | Admitting: Nurse Practitioner

## 2022-05-22 NOTE — Telephone Encounter (Signed)
Pts appt rescheduled to 06/03/22 at 1:30pm with Carl Best NP. Pt aware of appt. Discussed with her if her symptoms get worse prior to appt she can always contact her PCP or be seen at an urgent care. Pt aware.

## 2022-05-22 NOTE — Telephone Encounter (Signed)
This patient had an appointment with Nevin Bloodgood this morning, but due to Nevin Bloodgood being out sick, she had to reschedule to 1//19 with Colleen.  Patient states this was somewhat of an emergent visit as she has not had a bowel movement for 10 days, is very nauseous and dizzy, and has also been previously diagnosed with diverticulitis.  She said she is completely miserable and wanted to know if there was anything that could be done sooner rather than waiting until the middle of January..  Please call patient and advise.  Thank you.

## 2022-05-28 ENCOUNTER — Other Ambulatory Visit: Payer: BC Managed Care – PPO

## 2022-05-28 DIAGNOSIS — K625 Hemorrhage of anus and rectum: Secondary | ICD-10-CM

## 2022-05-28 DIAGNOSIS — R197 Diarrhea, unspecified: Secondary | ICD-10-CM | POA: Diagnosis not present

## 2022-05-29 LAB — GI PROFILE, STOOL, PCR

## 2022-05-30 LAB — CLOSTRIDIUM DIFFICILE BY PCR: Toxigenic C. Difficile by PCR: NEGATIVE

## 2022-06-03 ENCOUNTER — Ambulatory Visit: Payer: BC Managed Care – PPO | Admitting: Nurse Practitioner

## 2022-06-03 ENCOUNTER — Encounter: Payer: Self-pay | Admitting: Nurse Practitioner

## 2022-06-03 VITALS — BP 110/64 | HR 100 | Ht 68.0 in | Wt 166.0 lb

## 2022-06-03 DIAGNOSIS — K529 Noninfective gastroenteritis and colitis, unspecified: Secondary | ICD-10-CM | POA: Diagnosis not present

## 2022-06-03 DIAGNOSIS — K921 Melena: Secondary | ICD-10-CM | POA: Diagnosis not present

## 2022-06-03 NOTE — Patient Instructions (Addendum)
Advance diet as tolerated  Drink 8 glasses of water daily   Take Miralax 1 capful mixed in 8 ounces of water at bed time for constipation as tolerated  Contact our office if your abdominal pain or blood per the rectum recurs   Thank you for trusting me with your gastrointestinal care!   Carl Best, CRNP

## 2022-06-03 NOTE — Progress Notes (Unsigned)
06/03/2022 Crystal Brooks [id number] [birth year]   Chief Complaint:  History of Present Illness: Crystal Brooks. Crystal Brooks is a 57 year old female with a past medical history of chronic back pain, hyperlipidemia, pylori gastritis and diverticulosis and colon polyps.  Diagnosed with colon cancer at age 22.  She is followed by Dr. Henrene Pastor.   She developed lower abdominal pain 05/12/2022. She felt clammy, tingly with cold sweats then vomited up partially digested food. She continued to have abdominal cramping, passed sold stool then mush stool 1:30 - 2am was blood. She went to the ED.   She took   She did not take Cipro  Rash after she took one antibotic   [employer] Xmas day Miralax x 4 nights.  12/29 or 12/30 had a BM.  Normal BM daily      Latest Ref Rng & Units 05/16/2022   12:21 PM 03/08/2011    4:08 PM 03/04/2011    2:11 PM  CBC  WBC 4.0 - 10.5 K/uL 6.4  13.2  6.4   Hemoglobin 12.0 - 15.0 g/dL 14.9  11.7  11.3   Hematocrit 36.0 - 46.0 % 43.7  36.7  37.1   Platelets 150.0 - 400.0 K/uL 215.0  224  269        Latest Ref Rng & Units 05/16/2022   12:21 PM 04/07/2017    8:41 AM 04/08/2016    8:44 AM  CMP  Glucose 70 - 99 mg/dL 88     BUN 6 - 23 mg/dL 10     Creatinine 0.40 - 1.20 mg/dL 0.52     Sodium 135 - 145 mEq/L 141     Potassium 3.5 - 5.1 mEq/L 3.6     Chloride 96 - 112 mEq/L 103     CO2 19 - 32 mEq/L 31     Calcium 8.4 - 10.5 mg/dL 9.5     Total Protein 6.0 - 8.5 g/dL  6.8  6.6   Total Bilirubin 0.0 - 1.2 mg/dL  0.6  0.7   Alkaline Phos 39 - 117 IU/L  79  76   AST 0 - 40 IU/L  20  17   ALT 0 - 32 IU/L  29  18 March 2018 EGD for epigastric pain - Normal esophagus. - Mild gastritis. Biopsied. - Normal examined duodenum. No active ulceration..   October 2019 colonoscopy -history of colon polyps and family history colon cancer in a parent greater than 65 years of - Diverticulosis in the sigmoid colon. - Internal hemorrhoids. - The examination was  otherwise normal on direct and retroflexion views. - No specimens collected.       Current Medications, Allergies, Past Medical History, Past Surgical History, Family History and Social History were reviewed in [employer] record.   Review of Systems:   Constitutional: Negative for fever, sweats, chills or weight loss.  Respiratory: Negative for shortness of breath.   Cardiovascular: Negative for chest pain, palpitations and leg swelling.  Gastrointestinal: See HPI.  Musculoskeletal: Negative for back pain or muscle aches.  Neurological: Negative for dizziness, headaches or paresthesias.    Physical Exam: Ht '5\' 8"'$  (1.727 m)   Wt 166 lb (75.3 kg)   LMP 02/19/2011   BMI 25.24 kg/m  General: in no acute distress. Head: Normocephalic and atraumatic. Eyes: No scleral icterus. Conjunctiva pink . Ears: Normal auditory acuity. Mouth: Dentition intact. No ulcers or lesions.  Lungs: Clear throughout to auscultation.  Heart: Regular rate and rhythm, no murmur. Abdomen: Soft, nontender and nondistended. No masses or hepatomegaly. Normal bowel sounds x 4 quadrants.  Rectal: *** Musculoskeletal: Symmetrical with no gross deformities. Extremities: No edema. Neurological: Alert oriented x 4. No focal deficits.  Psychological: Alert and cooperative. Normal mood and affect  Assessment and Recommendations: ***

## 2022-06-04 DIAGNOSIS — K529 Noninfective gastroenteritis and colitis, unspecified: Secondary | ICD-10-CM | POA: Insufficient documentation

## 2022-06-04 DIAGNOSIS — K921 Melena: Secondary | ICD-10-CM | POA: Insufficient documentation

## 2022-06-04 NOTE — Progress Notes (Signed)
Crystal Brooks, Clinical picture most consistent with acute ischemic colitis.  Now resolved.  Supportive care.  No additional recommendations. Thanks, Dr. Henrene Pastor

## 2022-06-05 NOTE — Progress Notes (Signed)
I called the patient and provided her with Dr. Blanch Media input as noted per his addendum.  She will contact our office if she develops any further lower abdominal pain or blood per the rectum.

## 2022-06-14 ENCOUNTER — Ambulatory Visit: Payer: BC Managed Care – PPO | Admitting: Nurse Practitioner

## 2022-08-22 DIAGNOSIS — Z1231 Encounter for screening mammogram for malignant neoplasm of breast: Secondary | ICD-10-CM | POA: Diagnosis not present

## 2022-08-24 DIAGNOSIS — Z881 Allergy status to other antibiotic agents status: Secondary | ICD-10-CM | POA: Diagnosis not present

## 2022-08-24 DIAGNOSIS — W1830XA Fall on same level, unspecified, initial encounter: Secondary | ICD-10-CM | POA: Diagnosis not present

## 2022-08-24 DIAGNOSIS — Z880 Allergy status to penicillin: Secondary | ICD-10-CM | POA: Diagnosis not present

## 2022-08-24 DIAGNOSIS — S0990XA Unspecified injury of head, initial encounter: Secondary | ICD-10-CM | POA: Diagnosis not present

## 2022-08-24 DIAGNOSIS — R519 Headache, unspecified: Secondary | ICD-10-CM | POA: Diagnosis not present

## 2022-08-24 DIAGNOSIS — M542 Cervicalgia: Secondary | ICD-10-CM | POA: Diagnosis not present

## 2022-08-24 DIAGNOSIS — Z885 Allergy status to narcotic agent status: Secondary | ICD-10-CM | POA: Diagnosis not present

## 2022-08-24 DIAGNOSIS — Z882 Allergy status to sulfonamides status: Secondary | ICD-10-CM | POA: Diagnosis not present

## 2022-08-24 DIAGNOSIS — S161XXA Strain of muscle, fascia and tendon at neck level, initial encounter: Secondary | ICD-10-CM | POA: Diagnosis not present

## 2022-08-24 DIAGNOSIS — M546 Pain in thoracic spine: Secondary | ICD-10-CM | POA: Diagnosis not present

## 2022-09-10 ENCOUNTER — Encounter: Payer: Self-pay | Admitting: Sports Medicine

## 2022-09-10 ENCOUNTER — Ambulatory Visit: Payer: BC Managed Care – PPO | Admitting: Sports Medicine

## 2022-09-10 VITALS — BP 145/79 | HR 87 | Ht 68.0 in

## 2022-09-10 DIAGNOSIS — G8929 Other chronic pain: Secondary | ICD-10-CM

## 2022-09-10 DIAGNOSIS — M542 Cervicalgia: Secondary | ICD-10-CM

## 2022-09-10 DIAGNOSIS — M545 Low back pain, unspecified: Secondary | ICD-10-CM

## 2022-09-10 DIAGNOSIS — S060X0D Concussion without loss of consciousness, subsequent encounter: Secondary | ICD-10-CM

## 2022-09-10 DIAGNOSIS — S060XAA Concussion with loss of consciousness status unknown, initial encounter: Secondary | ICD-10-CM | POA: Insufficient documentation

## 2022-09-10 DIAGNOSIS — M5030 Other cervical disc degeneration, unspecified cervical region: Secondary | ICD-10-CM | POA: Insufficient documentation

## 2022-09-10 MED ORDER — MELOXICAM 15 MG PO TABS: ORAL_TABLET | ORAL | 3 refills | Status: DC

## 2022-09-10 MED ORDER — CYCLOBENZAPRINE HCL 10 MG PO TABS: ORAL_TABLET | ORAL | 0 refills | Status: AC

## 2022-09-10 NOTE — Assessment & Plan Note (Signed)
Slipped on dog urine about 2 weeks ago, hit head, had some dizziness, headaches, nausea. CT cervical spine and head in the ED were negative. I explained the pathophysiology of a concussion, this will recover but will require physical and cognitive rest, we discussed options.

## 2022-09-10 NOTE — Assessment & Plan Note (Signed)
Also having some neck pain after her fall, nothing radicular, bilateral over the trapezius, no progressive weakness. Declined steroids, we will add meloxicam, Flexeril and home conditioning. Return in 4 weeks, we will consider MRI if no better as persistent pain after a month is likely related to underlying spondylosis.

## 2022-09-10 NOTE — Assessment & Plan Note (Signed)
Historically controlled, but worsening after fall, as above she will do meloxicam and Flexeril, adding advanced herniated disc home conditioning, return to see me in 4 to 6 weeks.  MRI if no better.

## 2022-09-10 NOTE — Progress Notes (Signed)
    Procedures performed today:    None.  Independent interpretation of notes and tests performed by another provider:   None.  Brief History, Exam, Impression, and Recommendations:    Concussion Slipped on dog urine about 2 weeks ago, hit head, had some dizziness, headaches, nausea. CT cervical spine and head in the ED were negative. I explained the pathophysiology of a concussion, this will recover but will require physical and cognitive rest, we discussed options.   Acute neck pain Also having some neck pain after her fall, nothing radicular, bilateral over the trapezius, no progressive weakness. Declined steroids, we will add meloxicam, Flexeril and home conditioning. Return in 4 weeks, we will consider MRI if no better as persistent pain after a month is likely related to underlying spondylosis.  Low back pain Historically controlled, but worsening after fall, as above she will do meloxicam and Flexeril, adding advanced herniated disc home conditioning, return to see me in 4 to 6 weeks.  MRI if no better.    ____________________________________________ Ihor Austin. Benjamin Stain, M.D., ABFM., CAQSM., AME. Primary Care and Sports Medicine [facility]  Adjunct Professor of Family Medicine  [place] [employer] [facility] [employer]  Restaurant manager, fast food

## 2022-10-16 DIAGNOSIS — Z0000 Encounter for general adult medical examination without abnormal findings: Secondary | ICD-10-CM | POA: Diagnosis not present

## 2022-10-16 DIAGNOSIS — Z1329 Encounter for screening for other suspected endocrine disorder: Secondary | ICD-10-CM | POA: Diagnosis not present

## 2022-10-16 DIAGNOSIS — Z13220 Encounter for screening for lipoid disorders: Secondary | ICD-10-CM | POA: Diagnosis not present

## 2022-10-16 DIAGNOSIS — Z23 Encounter for immunization: Secondary | ICD-10-CM | POA: Diagnosis not present

## 2022-10-16 DIAGNOSIS — Z131 Encounter for screening for diabetes mellitus: Secondary | ICD-10-CM | POA: Diagnosis not present

## 2022-10-16 DIAGNOSIS — R1032 Left lower quadrant pain: Secondary | ICD-10-CM | POA: Diagnosis not present

## 2022-10-16 DIAGNOSIS — Z01419 Encounter for gynecological examination (general) (routine) without abnormal findings: Secondary | ICD-10-CM | POA: Diagnosis not present

## 2022-10-22 ENCOUNTER — Ambulatory Visit (INDEPENDENT_AMBULATORY_CARE_PROVIDER_SITE_OTHER): Payer: BC Managed Care – PPO | Admitting: Sports Medicine

## 2022-10-22 DIAGNOSIS — M545 Low back pain, unspecified: Secondary | ICD-10-CM | POA: Diagnosis not present

## 2022-10-22 DIAGNOSIS — G8929 Other chronic pain: Secondary | ICD-10-CM | POA: Diagnosis not present

## 2022-10-22 DIAGNOSIS — M542 Cervicalgia: Secondary | ICD-10-CM

## 2022-10-22 DIAGNOSIS — S060X0D Concussion without loss of consciousness, subsequent encounter: Secondary | ICD-10-CM

## 2022-10-22 NOTE — Assessment & Plan Note (Signed)
Persistent axial low back pain, principally nocturnal, improved to some degree but feels as though she has plateaued, she will get more consistent with the home physical therapy, if insufficient improvement we will then proceed with MRI, I have also given her some information on Neurontin for nighttime use.

## 2022-10-22 NOTE — Progress Notes (Signed)
    Procedures performed today:    None.  Independent interpretation of notes and tests performed by another provider:   None.  Brief History, Exam, Impression, and Recommendations:    Acute neck pain Pleasant 57 year old female, had some neck pain after a fall, improved with conservative treatment.  Concussion Slipped and fell and dog urine, doing better from a concussion standpoint.  Low back pain Persistent axial low back pain, principally nocturnal, improved to some degree but feels as though she has plateaued, she will get more consistent with the home physical therapy, if insufficient improvement we will then proceed with MRI, I have also given her some information on Neurontin for nighttime use.    ____________________________________________ Ihor Austin. Benjamin Stain, M.D., ABFM., CAQSM., AME. Primary Care and Sports Medicine [facility] [facility]  Adjunct Professor of Family Medicine  [place] [employer] [facility] [employer] [facility] of [employer]  Restaurant manager, fast food

## 2022-10-22 NOTE — Assessment & Plan Note (Signed)
Slipped and fell and dog urine, doing better from a concussion standpoint.

## 2022-10-22 NOTE — Assessment & Plan Note (Signed)
Pleasant 57 year old female, had some neck pain after a fall, improved with conservative treatment.

## 2022-11-25 ENCOUNTER — Ambulatory Visit: Payer: BC Managed Care – PPO | Admitting: Sports Medicine

## 2022-11-25 DIAGNOSIS — G8929 Other chronic pain: Secondary | ICD-10-CM | POA: Insufficient documentation

## 2022-11-25 DIAGNOSIS — R519 Headache, unspecified: Secondary | ICD-10-CM | POA: Diagnosis not present

## 2022-11-25 DIAGNOSIS — M542 Cervicalgia: Secondary | ICD-10-CM | POA: Diagnosis not present

## 2022-11-25 DIAGNOSIS — M545 Low back pain, unspecified: Secondary | ICD-10-CM

## 2022-11-25 NOTE — Progress Notes (Signed)
    Procedures performed today:    None.  Independent interpretation of notes and tests performed by another provider:   None.  Brief History, Exam, Impression, and Recommendations:    Chronic neck pain This is a pleasant 57 year old female, she slipped and fell and dog urine sometime at the end of March, we treated her April and May conservatively, she did have a CT scan of her neck which was normal. X-rays normal. She improved somewhat with conservative treatment. Unfortunately she is complaining of increasing discomfort in her neck. The neck pain is fairly symmetric, trapezial, periscapular with occasional radiation down the left upper shoulder. At this point she has done home conditioning, she is taken meloxicam, we did suggest gabapentin at the last visit. Due to persistence of discomfort for greater than 6 weeks in spite of physical therapy we will proceed with MRI. Since the cervical spine CT was normal I am suspicious that the C-spine MRI will show only minimal findings raising the likelihood of a myofascial pain syndrome/fibromyalgia.   Low back pain Back pain has become chronic, as above we treated her several months ago and things improved to some degree, x-ray imaging was unrevealing and she has not had advanced imaging, we will proceed with MRI of the lumbar spine. As above we will treat the findings aggressively but I did prepare her for the possibility that the imaging would be for the most part normal or that treatment for orthopedic disease would be unhelpful and we would need to shift the focus to more of a fibromyalgia/myofascial type approach.  Chronic headaches Unclear etiology, I did advise her she needs to establish care with a primary care provider for further investigation and management. It is possible that this is part of fibromyalgia picture. She does deny any depressive symptoms today.  I spent 30 minutes of total time managing this patient today, this  includes chart review, face to face, and non-face to face time.  ____________________________________________ Ihor Austin. Benjamin Stain, M.D., ABFM., CAQSM., AME. Primary Care and Sports Medicine [facility]  Adjunct Professor of Family Medicine  [place] [employer] [facility] [employer]  Restaurant manager, fast food

## 2022-11-25 NOTE — Assessment & Plan Note (Signed)
Back pain has become chronic, as above we treated her several months ago and things improved to some degree, x-ray imaging was unrevealing and she has not had advanced imaging, we will proceed with MRI of the lumbar spine. As above we will treat the findings aggressively but I did prepare her for the possibility that the imaging would be for the most part normal or that treatment for orthopedic disease would be unhelpful and we would need to shift the focus to more of a fibromyalgia/myofascial type approach.

## 2022-11-25 NOTE — Assessment & Plan Note (Signed)
This is a pleasant 57 year old female, she slipped and fell and dog urine sometime at the end of March, we treated her April and May conservatively, she did have a CT scan of her neck which was normal. X-rays normal. She improved somewhat with conservative treatment. Unfortunately she is complaining of increasing discomfort in her neck. The neck pain is fairly symmetric, trapezial, periscapular with occasional radiation down the left upper shoulder. At this point she has done home conditioning, she is taken meloxicam, we did suggest gabapentin at the last visit. Due to persistence of discomfort for greater than 6 weeks in spite of physical therapy we will proceed with MRI. Since the cervical spine CT was normal I am suspicious that the C-spine MRI will show only minimal findings raising the likelihood of a myofascial pain syndrome/fibromyalgia.

## 2022-11-25 NOTE — Assessment & Plan Note (Signed)
Unclear etiology, I did advise her she needs to establish care with a primary care provider for further investigation and management. It is possible that this is part of fibromyalgia picture. She does deny any depressive symptoms today.

## 2022-12-01 ENCOUNTER — Ambulatory Visit: Payer: BC Managed Care – PPO

## 2022-12-01 DIAGNOSIS — M4802 Spinal stenosis, cervical region: Secondary | ICD-10-CM | POA: Diagnosis not present

## 2022-12-01 DIAGNOSIS — D1809 Hemangioma of other sites: Secondary | ICD-10-CM | POA: Diagnosis not present

## 2022-12-01 DIAGNOSIS — M542 Cervicalgia: Secondary | ICD-10-CM

## 2022-12-01 DIAGNOSIS — M47812 Spondylosis without myelopathy or radiculopathy, cervical region: Secondary | ICD-10-CM | POA: Diagnosis not present

## 2022-12-01 DIAGNOSIS — G8929 Other chronic pain: Secondary | ICD-10-CM

## 2022-12-01 DIAGNOSIS — M545 Low back pain, unspecified: Secondary | ICD-10-CM | POA: Diagnosis not present

## 2022-12-01 DIAGNOSIS — M5126 Other intervertebral disc displacement, lumbar region: Secondary | ICD-10-CM | POA: Diagnosis not present

## 2022-12-01 DIAGNOSIS — M5137 Other intervertebral disc degeneration, lumbosacral region: Secondary | ICD-10-CM | POA: Diagnosis not present

## 2022-12-01 DIAGNOSIS — M4807 Spinal stenosis, lumbosacral region: Secondary | ICD-10-CM | POA: Diagnosis not present

## 2022-12-03 ENCOUNTER — Ambulatory Visit: Payer: BC Managed Care – PPO | Admitting: Sports Medicine

## 2022-12-11 ENCOUNTER — Ambulatory Visit: Payer: BC Managed Care – PPO | Admitting: Sports Medicine

## 2022-12-11 DIAGNOSIS — M51369 Other intervertebral disc degeneration, lumbar region without mention of lumbar back pain or lower extremity pain: Secondary | ICD-10-CM

## 2022-12-11 DIAGNOSIS — M5136 Other intervertebral disc degeneration, lumbar region: Secondary | ICD-10-CM

## 2022-12-11 DIAGNOSIS — M5030 Other cervical disc degeneration, unspecified cervical region: Secondary | ICD-10-CM

## 2022-12-11 MED ORDER — GABAPENTIN 300 MG PO CAPS
ORAL_CAPSULE | ORAL | 3 refills | Status: AC
Start: 2022-12-11 — End: ?

## 2022-12-11 NOTE — Assessment & Plan Note (Signed)
Cervical spinal stenosis were C5-C6 with disc protrusion and ligamentum flavum hypertrophy, she has improved to some degree, but still has significant discomfort, we will add gabapentin. Return to see me as needed.

## 2022-12-11 NOTE — Progress Notes (Signed)
    Procedures performed today:    None.  Independent interpretation of notes and tests performed by another provider:   None.  Brief History, Exam, Impression, and Recommendations:    DDD (degenerative disc disease), cervical Cervical spinal stenosis were C5-C6 with disc protrusion and ligamentum flavum hypertrophy, she has improved to some degree, but still has significant discomfort, we will add gabapentin. Return to see me as needed.  Lumbar degenerative disc disease L4-S1 disc desiccation with annular tearing on MRI, she should start home conditioning, adding Neurontin, we will consider epidurals if not better.    ____________________________________________ Ihor Austin. Benjamin Stain, M.D., ABFM., CAQSM., AME. Primary Care and Sports Medicine [facility]  Adjunct Professor of Family Medicine  [employer] [employer] [facility] of Medicine  Restaurant manager, fast food

## 2022-12-11 NOTE — Assessment & Plan Note (Signed)
L4-S1 disc desiccation with annular tearing on MRI, she should start home conditioning, adding Neurontin, we will consider epidurals if not better.

## 2022-12-13 ENCOUNTER — Ambulatory Visit: Payer: BC Managed Care – PPO | Admitting: Family Medicine

## 2023-01-22 ENCOUNTER — Ambulatory Visit: Payer: BC Managed Care – PPO | Admitting: Sports Medicine

## 2023-02-19 DIAGNOSIS — L820 Inflamed seborrheic keratosis: Secondary | ICD-10-CM | POA: Diagnosis not present

## 2023-02-19 DIAGNOSIS — D1801 Hemangioma of skin and subcutaneous tissue: Secondary | ICD-10-CM | POA: Diagnosis not present

## 2023-02-19 DIAGNOSIS — L821 Other seborrheic keratosis: Secondary | ICD-10-CM | POA: Diagnosis not present

## 2023-02-19 DIAGNOSIS — D485 Neoplasm of uncertain behavior of skin: Secondary | ICD-10-CM | POA: Diagnosis not present

## 2023-02-19 DIAGNOSIS — Z129 Encounter for screening for malignant neoplasm, site unspecified: Secondary | ICD-10-CM | POA: Diagnosis not present

## 2023-02-19 DIAGNOSIS — D369 Benign neoplasm, unspecified site: Secondary | ICD-10-CM | POA: Diagnosis not present

## 2023-03-04 ENCOUNTER — Ambulatory Visit: Payer: BC Managed Care – PPO | Admitting: Family Medicine

## 2023-03-04 ENCOUNTER — Encounter: Payer: Self-pay | Admitting: Family Medicine

## 2023-03-04 VITALS — BP 133/84 | HR 81 | Ht 68.0 in | Wt 168.5 lb

## 2023-03-04 DIAGNOSIS — J3489 Other specified disorders of nose and nasal sinuses: Secondary | ICD-10-CM | POA: Diagnosis not present

## 2023-03-04 NOTE — Progress Notes (Signed)
New patient visit   Patient: Crystal Brooks   DOB: [birth year]   57 y.o. Female  MRN: [id number] Visit Date: 03/04/2023  Today's healthcare provider: Charlton Amor, DO   Chief Complaint  Patient presents with   left sided nasal irritation    X June 2024.  Pruritic , dry, tender Left side predominantly.    SUBJECTIVE    Chief Complaint  Patient presents with   left sided nasal irritation    X June 2024.  Pruritic , dry, tender Left side predominantly.   HPI HPI     left sided nasal irritation    Additional comments: X June 2024.  Pruritic , dry, tender Left side predominantly.      Last edited by Elizabeth Palau, LPN on 40/01/8118  8:52 AM.      Pt presents to establish care. Has concerns today of nasal irritation and dryness. Has tried to use nasal saline gel that has only been effective for about an hour. Says she will use a tissue when it gets really irritated to stick up her nose but denies any nose picking.  Review of Systems  Constitutional:  Negative for activity change, fatigue and fever.  HENT:         Nasal irritation  Respiratory:  Negative for cough and shortness of breath.   Cardiovascular:  Negative for chest pain.  Gastrointestinal:  Negative for abdominal pain.  Genitourinary:  Negative for difficulty urinating.       Current Meds  Medication Sig   cholecalciferol (VITAMIN D3) 25 MCG (1000 UNIT) tablet Take 1,000 Units by mouth daily.   cyclobenzaprine (FLEXERIL) 10 MG tablet 0.5-1 tab p.o. nightly   gabapentin (NEURONTIN) 300 MG capsule One tab PO qHS for a week, then BID for a week, then TID. May double weekly to a max of 3,600mg /day    OBJECTIVE    BP 133/84   Pulse 81   Ht 5\' 8"  (1.727 m)   Wt 168 lb 8 oz (76.4 kg)   LMP 02/19/2011   SpO2 99%   BMI 25.62 kg/m   Physical Exam Vitals and nursing note reviewed.  Constitutional:      General: She is not in acute distress.    Appearance: Normal appearance.  HENT:     Head:  Normocephalic and atraumatic.     Right Ear: Tympanic membrane, ear canal and external ear normal.     Left Ear: Tympanic membrane, ear canal and external ear normal.     Nose:     Comments: Some erythema of nares bilaterally Eyes:     Conjunctiva/sclera: Conjunctivae normal.  Cardiovascular:     Rate and Rhythm: Normal rate and regular rhythm.  Pulmonary:     Effort: Pulmonary effort is normal.     Breath sounds: Normal breath sounds.  Neurological:     General: No focal deficit present.     Mental Status: She is alert and oriented to person, place, and time.  Psychiatric:        Mood and Affect: Mood normal.        Behavior: Behavior normal.        Thought Content: Thought content normal.        Judgment: Judgment normal.        ASSESSMENT & PLAN    Problem List Items Addressed This Visit       Other   Nasal dryness - Primary    Pt presents with nasal  dryness. Recommended nasal saline wash however pt says she uses a nasal gel that does give any relief. Denies any allergy symptoms--no erythema of throat, ear exam wnl  - recommend ENT referral for further evaluation of nasal turbinates b/l      Relevant Orders   Ambulatory referral to ENT    No follow-ups on file.      No orders of the defined types were placed in this encounter.   Orders Placed This Encounter  Procedures   Ambulatory referral to ENT    Referral Priority:   Routine    Referral Type:   Consultation    Referral Reason:   Specialty Services Required    Requested Specialty:   Otolaryngology    Number of Visits Requested:   1     Charlton Amor, DO  [facility] Primary Care & Sports Medicine at [facility] [phone] (phone) [phone] (fax)  [facility]

## 2023-03-04 NOTE — Assessment & Plan Note (Signed)
Pt presents with nasal dryness. Recommended nasal saline wash however pt says she uses a nasal gel that does give any relief. Denies any allergy symptoms--no erythema of throat, ear exam wnl  - recommend ENT referral for further evaluation of nasal turbinates b/l

## 2023-04-01 ENCOUNTER — Ambulatory Visit (INDEPENDENT_AMBULATORY_CARE_PROVIDER_SITE_OTHER): Payer: BC Managed Care – PPO | Admitting: Otolaryngology

## 2023-04-01 ENCOUNTER — Encounter (INDEPENDENT_AMBULATORY_CARE_PROVIDER_SITE_OTHER): Payer: Self-pay

## 2023-04-01 VITALS — Ht 68.0 in | Wt 165.0 lb

## 2023-04-01 DIAGNOSIS — J3489 Other specified disorders of nose and nasal sinuses: Secondary | ICD-10-CM | POA: Diagnosis not present

## 2023-04-01 DIAGNOSIS — J310 Chronic rhinitis: Secondary | ICD-10-CM | POA: Diagnosis not present

## 2023-04-01 MED ORDER — CLINDAMYCIN HCL 300 MG PO CAPS: 300.0000 mg | ORAL_CAPSULE | Freq: Three times a day (TID) | ORAL | 0 refills | Status: AC

## 2023-04-01 MED ORDER — MUPIROCIN 2 % EX OINT: 1.0000 | TOPICAL_OINTMENT | Freq: Two times a day (BID) | CUTANEOUS | 0 refills | Status: AC

## 2023-04-01 NOTE — Progress Notes (Signed)
Dear Dr. Tamera Punt, Here is my assessment for our mutual patient, Crystal Brooks. Thank you for allowing me the opportunity to care for your patient. Please do not hesitate to contact me should you have any other questions. Sincerely, Dr. Jovita Kussmaul  Otolaryngology Clinic Note Referring provider: Dr. Tamera Punt HPI:  Crystal Brooks is a 57 y.o. female kindly referred by Dr. Tamera Punt for evaluation of nasal dryness. Back in June, started to have some itching. On left, then started to have it on right (months later). Then some burning and discomfort. Then became very dry, and will have some mucoid rhinorrhea.  No facial pain, pressure, no throat symptoms, no eye itching or discomfort, no discolored drainage, normally does not get sinus infections.   She has tried saline gel (burns), then a few minutes of relief, and then dries it. Has not used any other sprays. Denies typical AR symptoms. No new meds or medication changes. Has two dogs, but never bothered her. No nasal itching  No new exposures for nasal cavity  Not in healthcare, no nasal picking or trauma  No AI symptoms - no epistaxis. No cough or pulm symptoms  PMHx: Neck Pain, Back Pain  H&N Surgery: Meningioma removal Personal or FHx of bleeding dz or anesthesia difficulty: no  GLP-1: no AP/AC: no  Tobacco: no. Alcohol: no. Occupation: homemaker.  Independent Review of Additional Tests or Records:  Referral Notes reviewed independently and summarized above CRP/ESR: wnl (04/2022) CBC (12/23): Eos 0.3 MR C-spine (2024): independent review of sinuses and NP - no masses noted, visualized sinuses (sphenoid, nasal cavity partial, partial max and ethmoids without significant disease) CTH 07/2022: images not available but independent review of read   PMH/Meds/All/SocHx/FamHx/ROS:   Past Medical History:  Diagnosis Date   Colitis    Diverticulosis    Fibroids    Hyperlipidemia    Pelvic pain in female    PONV (postoperative nausea  and vomiting)      Past Surgical History:  Procedure Laterality Date   CARPAL TUNNEL RELEASE Right 2021   COLONOSCOPY     DILATION AND CURETTAGE OF UTERUS     x 2   resection of meningioma     2006   svd     x 1   VAGINAL HYSTERECTOMY     still has ovaries    Family History  Problem Relation Age of Onset   Colon cancer Mother    Other Father        Creutzfeldt-Jakob disease   Hyperlipidemia Brother    Esophageal cancer Neg Hx    Liver cancer Neg Hx    Pancreatic cancer Neg Hx    Rectal cancer Neg Hx    Stomach cancer Neg Hx      Social Connections: Moderately Isolated (09/09/2022)   Social Connection and Isolation Panel [NHANES]    Frequency of Communication with Friends and Family: Twice a week    Frequency of Social Gatherings with Friends and Family: Once a week    Attends Religious Services: Never    Database administrator or Organizations: No    Attends Engineer, structural: Not on file    Marital Status: Married      Current Outpatient Medications:    cholecalciferol (VITAMIN D3) 25 MCG (1000 UNIT) tablet, Take 1,000 Units by mouth daily., Disp: , Rfl:    clindamycin (CLEOCIN) 300 MG capsule, Take 1 capsule (300 mg total) by mouth 3 (three) times daily for 7 days., Disp: 21 capsule,  Rfl: 0   mupirocin ointment (BACTROBAN) 2 %, Apply 1 Application topically 2 (two) times daily for 14 days. Apply a pea sized amount to both nostrils twice per day, Disp: 22 g, Rfl: 0   cyclobenzaprine (FLEXERIL) 10 MG tablet, 0.5-1 tab p.o. nightly, Disp: 30 tablet, Rfl: 0   gabapentin (NEURONTIN) 300 MG capsule, One tab PO qHS for a week, then BID for a week, then TID. May double weekly to a max of 3,600mg /day, Disp: 90 capsule, Rfl: 3   Physical Exam:   Ht 5\' 8"  (1.727 m)   Wt 165 lb (74.8 kg)   LMP 02/19/2011   BMI 25.09 kg/m   Salient findings:  CN II-XII intact  Bilateral EAC clear and TM intact with well pneumatized middle ear spaces Anterior rhinoscopy:  Septum deviates right; bilateral inferior turbinates without significant hypertrophy; honey colored crusts bilateral nasal vestibule; given nature of complaints related to nasal cavity, nasal endoscopy was indicated to better evaluate nasal cavity No lesions of oral cavity/oropharynx; dentition fair No obviously palpable neck masses/lymphadenopathy/thyromegaly No respiratory distress or stridor  Seprately Identifiable Procedures:  PROCEDURE: Bilateral Diagnostic Rigid Nasal Endoscopy Pre-procedure diagnosis: Nasal dryness, rhinitis Post-procedure diagnosis: same Indication: See pre-procedure diagnosis and physical exam above Complications: None apparent EBL: 0 mL Anesthesia: Lidocaine 4% and topical decongestant was topically sprayed in each nasal cavity  Description of Procedure:  Patient was identified. A rigid 0 degree endoscope was utilized to evaluate the sinonasal cavities, mucosa, sinus ostia and turbinates and septum.  Overall, signs of mucosal inflammation are noted.  Also noted are bilateral vestibule honey crusts and anterior septal crusting and head of inferior turbinate crusting. No mucopurulence, polyps, or masses noted.  Past anterior nasal cavity, no significant mucosal dryness. A little lumpy bumpy anteriorly? Right Middle meatus: clear Right SE Recess: clear Left MM: clear Left SE Recess: clear    Photodocumentation was obtained.  Honey colored crusts bilateral nasal vestibules.  CPT CODE -- 82956 - Mod 25   Impression & Plans:  [name] is a 57 y.o. female without significant PMHx now with: Nasal dryness and discomfort Bilateral mucoid rhinorrhea No other significant antecedent event or sx, but clear honey colored crusts over vestibule b/l and some over anterior septum and head of IT b/l. Query if she has sx related to staph vestibulitis/infxn. Mucosa posteriorly more normal. No Auto-immune sx but in differential as underlying cause (unlikely though) -will  start with conservative rx  - Mupirocin ointment BID x14d (instructed how) - Allergy to bactrim so will do short course of clindamycin TID x7d - avoid nasal trauma/manipulation  - f/u in ~3-4 weeks  See below regarding exact medications prescribed this encounter including dosages and route: Meds ordered this encounter  Medications   mupirocin ointment (BACTROBAN) 2 %    Sig: Apply 1 Application topically 2 (two) times daily for 14 days. Apply a pea sized amount to both nostrils twice per day    Dispense:  22 g    Refill:  0   clindamycin (CLEOCIN) 300 MG capsule    Sig: Take 1 capsule (300 mg total) by mouth 3 (three) times daily for 7 days.    Dispense:  21 capsule    Refill:  0    Thank you for allowing me the opportunity to care for your patient. Please do not hesitate to contact me should you have any other questions.  Sincerely, Jovita Kussmaul, MD Otolarynoglogist (ENT), [facility] Phone: [phone] Fax: [phone]  04/01/2023, 9:30 AM   I have personally spent 48 minutes involved in face-to-face and non-face-to-face activities for this patient on the day of the visit.  Professional time spent includes the following activities, in addition to those noted in the documentation: preparing to see the patient (review of outside documentation and results), performing a medically appropriate examination and/or evaluation, counseling and educating the patient/family/caregiver, ordering medications, performing procedures (endoscopy), referring and communicating with other healthcare professionals, documenting clinical information in the electronic or other health record, independently interpreting results and communicating results with the patient

## 2023-04-29 ENCOUNTER — Ambulatory Visit (INDEPENDENT_AMBULATORY_CARE_PROVIDER_SITE_OTHER): Payer: BC Managed Care – PPO

## 2023-04-29 ENCOUNTER — Telehealth (INDEPENDENT_AMBULATORY_CARE_PROVIDER_SITE_OTHER): Payer: Self-pay | Admitting: Otolaryngology

## 2023-04-29 NOTE — Telephone Encounter (Signed)
Called patient and let her know it is ok. To take the antibiotic TID for 7 days. Her appointment is not until the 19th. Then she will be done with her medication before she comes back to follow up with Allena Katz.
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
## 2023-05-15 ENCOUNTER — Ambulatory Visit (INDEPENDENT_AMBULATORY_CARE_PROVIDER_SITE_OTHER): Payer: BC Managed Care – PPO

## 2023-05-19 DIAGNOSIS — K130 Diseases of lips: Secondary | ICD-10-CM | POA: Diagnosis not present

## 2023-06-04 ENCOUNTER — Ambulatory Visit (INDEPENDENT_AMBULATORY_CARE_PROVIDER_SITE_OTHER): Payer: BC Managed Care – PPO

## 2023-07-30 DIAGNOSIS — E8721 Acute metabolic acidosis: Secondary | ICD-10-CM | POA: Diagnosis not present

## 2023-07-30 DIAGNOSIS — Z79899 Other long term (current) drug therapy: Secondary | ICD-10-CM | POA: Diagnosis not present

## 2023-07-30 DIAGNOSIS — R079 Chest pain, unspecified: Secondary | ICD-10-CM | POA: Diagnosis not present

## 2023-07-30 DIAGNOSIS — K5730 Diverticulosis of large intestine without perforation or abscess without bleeding: Secondary | ICD-10-CM | POA: Diagnosis not present

## 2023-07-30 DIAGNOSIS — K3532 Acute appendicitis with perforation and localized peritonitis, without abscess: Secondary | ICD-10-CM | POA: Diagnosis not present

## 2023-07-30 DIAGNOSIS — D72829 Elevated white blood cell count, unspecified: Secondary | ICD-10-CM | POA: Diagnosis not present

## 2023-07-30 DIAGNOSIS — Z882 Allergy status to sulfonamides status: Secondary | ICD-10-CM | POA: Diagnosis not present

## 2023-07-30 DIAGNOSIS — K660 Peritoneal adhesions (postprocedural) (postinfection): Secondary | ICD-10-CM | POA: Diagnosis not present

## 2023-07-30 DIAGNOSIS — Z880 Allergy status to penicillin: Secondary | ICD-10-CM | POA: Diagnosis not present

## 2023-07-30 DIAGNOSIS — R1031 Right lower quadrant pain: Secondary | ICD-10-CM | POA: Diagnosis not present

## 2023-07-30 DIAGNOSIS — Z9889 Other specified postprocedural states: Secondary | ICD-10-CM | POA: Diagnosis not present

## 2023-07-30 DIAGNOSIS — Z888 Allergy status to other drugs, medicaments and biological substances status: Secondary | ICD-10-CM | POA: Diagnosis not present

## 2023-07-30 DIAGNOSIS — Z885 Allergy status to narcotic agent status: Secondary | ICD-10-CM | POA: Diagnosis not present

## 2023-09-04 DIAGNOSIS — Z9089 Acquired absence of other organs: Secondary | ICD-10-CM | POA: Diagnosis not present

## 2023-09-04 DIAGNOSIS — Z9049 Acquired absence of other specified parts of digestive tract: Secondary | ICD-10-CM | POA: Diagnosis not present

## 2023-09-04 DIAGNOSIS — R109 Unspecified abdominal pain: Secondary | ICD-10-CM | POA: Diagnosis not present

## 2023-09-04 DIAGNOSIS — K668 Other specified disorders of peritoneum: Secondary | ICD-10-CM | POA: Diagnosis not present

## 2023-09-23 DIAGNOSIS — Z1231 Encounter for screening mammogram for malignant neoplasm of breast: Secondary | ICD-10-CM | POA: Diagnosis not present

## 2023-10-02 ENCOUNTER — Encounter: Payer: Self-pay | Admitting: Family Medicine

## 2023-12-15 DIAGNOSIS — Z01419 Encounter for gynecological examination (general) (routine) without abnormal findings: Secondary | ICD-10-CM | POA: Diagnosis not present

## 2023-12-15 DIAGNOSIS — Z1331 Encounter for screening for depression: Secondary | ICD-10-CM | POA: Diagnosis not present

## 2023-12-23 DIAGNOSIS — Z0000 Encounter for general adult medical examination without abnormal findings: Secondary | ICD-10-CM | POA: Diagnosis not present

## 2023-12-23 DIAGNOSIS — Z131 Encounter for screening for diabetes mellitus: Secondary | ICD-10-CM | POA: Diagnosis not present

## 2023-12-23 DIAGNOSIS — Z13220 Encounter for screening for lipoid disorders: Secondary | ICD-10-CM | POA: Diagnosis not present

## 2023-12-23 DIAGNOSIS — Z1329 Encounter for screening for other suspected endocrine disorder: Secondary | ICD-10-CM | POA: Diagnosis not present

## 2024-01-27 ENCOUNTER — Encounter: Payer: Self-pay | Admitting: Sports Medicine

## 2024-05-25 ENCOUNTER — Ambulatory Visit: Payer: Self-pay

## 2024-08-12 ENCOUNTER — Ambulatory Visit: Admitting: Family Medicine
